# Patient Record
Sex: Male | Born: 1980 | Race: White | Hispanic: No | Marital: Single | Smoking: Never smoker
Health system: Southern US, Community
[De-identification: ages and names within clinical notes are randomized; demographics above are authoritative.]

## PROBLEM LIST (undated history)

## (undated) DIAGNOSIS — K219 Gastro-esophageal reflux disease without esophagitis: Secondary | ICD-10-CM

## (undated) DIAGNOSIS — M359 Systemic involvement of connective tissue, unspecified: Secondary | ICD-10-CM

## (undated) DIAGNOSIS — N183 Chronic kidney disease, stage 3 unspecified: Secondary | ICD-10-CM

## (undated) DIAGNOSIS — K5090 Crohn's disease, unspecified, without complications: Secondary | ICD-10-CM

## (undated) DIAGNOSIS — N289 Disorder of kidney and ureter, unspecified: Secondary | ICD-10-CM

## (undated) DIAGNOSIS — K469 Unspecified abdominal hernia without obstruction or gangrene: Secondary | ICD-10-CM

## (undated) HISTORY — PX: COLONOSCOPY: SHX174

## (undated) HISTORY — PX: OTHER SURGICAL HISTORY: SHX169

## (undated) HISTORY — PX: ESOPHAGOGASTRODUODENOSCOPY: SHX1529

---

## 2012-08-27 ENCOUNTER — Emergency Department: Payer: Self-pay

## 2012-08-27 LAB — CBC
HCT: 46.2 % (ref 40.0–52.0)
HGB: 15.9 g/dL (ref 13.0–18.0)
MCH: 33.1 pg (ref 26.0–34.0)
MCHC: 34.4 g/dL (ref 32.0–36.0)
MCV: 96 fL (ref 80–100)
RDW: 13.1 % (ref 11.5–14.5)
WBC: 11 10*3/uL — ABNORMAL HIGH (ref 3.8–10.6)

## 2012-08-27 LAB — COMPREHENSIVE METABOLIC PANEL
Albumin: 4.2 g/dL (ref 3.4–5.0)
Anion Gap: 3 — ABNORMAL LOW (ref 7–16)
Bilirubin,Total: 0.4 mg/dL (ref 0.2–1.0)
Calcium, Total: 8.8 mg/dL (ref 8.5–10.1)
Chloride: 102 mmol/L (ref 98–107)
Co2: 33 mmol/L — ABNORMAL HIGH (ref 21–32)
Creatinine: 1.36 mg/dL — ABNORMAL HIGH (ref 0.60–1.30)
Glucose: 90 mg/dL (ref 65–99)
Osmolality: 275 (ref 275–301)
Potassium: 4.1 mmol/L (ref 3.5–5.1)
SGOT(AST): 28 U/L (ref 15–37)
SGPT (ALT): 33 U/L (ref 12–78)
Sodium: 138 mmol/L (ref 136–145)
Total Protein: 7.6 g/dL (ref 6.4–8.2)

## 2012-08-27 LAB — LIPASE, BLOOD: Lipase: 504 U/L — ABNORMAL HIGH (ref 73–393)

## 2012-12-11 ENCOUNTER — Emergency Department: Payer: Self-pay | Admitting: Emergency Medicine

## 2012-12-11 LAB — COMPREHENSIVE METABOLIC PANEL
Albumin: 4.3 g/dL (ref 3.4–5.0)
Alkaline Phosphatase: 82 U/L (ref 50–136)
BUN: 13 mg/dL (ref 7–18)
Calcium, Total: 9.5 mg/dL (ref 8.5–10.1)
Chloride: 107 mmol/L (ref 98–107)
Creatinine: 1.64 mg/dL — ABNORMAL HIGH (ref 0.60–1.30)
EGFR (Non-African Amer.): 55 — ABNORMAL LOW
Glucose: 98 mg/dL (ref 65–99)
Osmolality: 285 (ref 275–301)
SGOT(AST): 66 U/L — ABNORMAL HIGH (ref 15–37)
Total Protein: 7.5 g/dL (ref 6.4–8.2)

## 2012-12-11 LAB — CBC
MCHC: 34.6 g/dL (ref 32.0–36.0)
MCV: 95 fL (ref 80–100)
WBC: 9.4 10*3/uL (ref 3.8–10.6)

## 2012-12-22 ENCOUNTER — Ambulatory Visit: Payer: Self-pay | Admitting: Gastroenterology

## 2012-12-25 ENCOUNTER — Ambulatory Visit: Payer: Self-pay | Admitting: Gastroenterology

## 2012-12-29 ENCOUNTER — Ambulatory Visit: Payer: Self-pay | Admitting: Gastroenterology

## 2013-01-01 ENCOUNTER — Emergency Department: Payer: Self-pay | Admitting: Emergency Medicine

## 2013-01-01 LAB — URINALYSIS, COMPLETE
Blood: NEGATIVE
Ketone: NEGATIVE
Nitrite: NEGATIVE
Ph: 6 (ref 4.5–8.0)
Protein: NEGATIVE
WBC UR: <1 /HPF (ref 0–5)

## 2013-01-01 LAB — CBC
HCT: 44.4 % (ref 40.0–52.0)
HGB: 15.7 g/dL (ref 13.0–18.0)
MCV: 93 fL (ref 80–100)
Platelet: 223 10*3/uL (ref 150–440)
RBC: 4.76 10*6/uL (ref 4.40–5.90)
RDW: 13.4 % (ref 11.5–14.5)
WBC: 10.1 10*3/uL (ref 3.8–10.6)

## 2013-01-01 LAB — COMPREHENSIVE METABOLIC PANEL
Anion Gap: 7 (ref 7–16)
BUN: 14 mg/dL (ref 7–18)
Bilirubin,Total: 0.6 mg/dL (ref 0.2–1.0)
Calcium, Total: 9.8 mg/dL (ref 8.5–10.1)
Co2: 28 mmol/L (ref 21–32)
Creatinine: 1.56 mg/dL — ABNORMAL HIGH (ref 0.60–1.30)
EGFR (African American): >60
EGFR (Non-African Amer.): 58 — ABNORMAL LOW
Osmolality: 287 (ref 275–301)
Potassium: 3.5 mmol/L (ref 3.5–5.1)
SGOT(AST): 47 U/L — ABNORMAL HIGH (ref 15–37)
SGPT (ALT): 61 U/L (ref 12–78)
Sodium: 143 mmol/L (ref 136–145)
Total Protein: 7.6 g/dL (ref 6.4–8.2)

## 2013-01-08 ENCOUNTER — Ambulatory Visit: Payer: Self-pay | Admitting: Urology

## 2013-03-31 ENCOUNTER — Emergency Department: Payer: Self-pay | Admitting: Emergency Medicine

## 2013-03-31 LAB — CBC
HCT: 45.3 % (ref 40.0–52.0)
HGB: 15.9 g/dL (ref 13.0–18.0)
MCH: 33.7 pg (ref 26.0–34.0)
MCHC: 35.1 g/dL (ref 32.0–36.0)
RBC: 4.72 10*6/uL (ref 4.40–5.90)
RDW: 12.9 % (ref 11.5–14.5)
WBC: 6.9 10*3/uL (ref 3.8–10.6)

## 2013-03-31 LAB — BASIC METABOLIC PANEL
Calcium, Total: 9.4 mg/dL (ref 8.5–10.1)
Chloride: 107 mmol/L (ref 98–107)
Co2: 28 mmol/L (ref 21–32)
Creatinine: 1.4 mg/dL — ABNORMAL HIGH (ref 0.60–1.30)
EGFR (Non-African Amer.): >60
Glucose: 94 mg/dL (ref 65–99)

## 2013-03-31 LAB — URINALYSIS, COMPLETE
Bilirubin,UR: NEGATIVE
Glucose,UR: NEGATIVE mg/dL (ref 0–75)
Ketone: NEGATIVE
Nitrite: NEGATIVE
Protein: NEGATIVE
Specific Gravity: 1.017 (ref 1.003–1.030)

## 2013-05-20 ENCOUNTER — Ambulatory Visit: Payer: Self-pay | Admitting: Urology

## 2013-06-01 ENCOUNTER — Ambulatory Visit: Payer: Self-pay | Admitting: Gastroenterology

## 2013-07-07 ENCOUNTER — Ambulatory Visit: Payer: Self-pay | Admitting: Gastroenterology

## 2013-09-11 ENCOUNTER — Emergency Department: Payer: Self-pay | Admitting: Emergency Medicine

## 2013-09-11 LAB — URINALYSIS, COMPLETE
BACTERIA: NONE SEEN
Bilirubin,UR: NEGATIVE
Blood: NEGATIVE
Glucose,UR: NEGATIVE mg/dL (ref 0–75)
KETONE: NEGATIVE
Leukocyte Esterase: NEGATIVE
Nitrite: NEGATIVE
Ph: 7 (ref 4.5–8.0)
Protein: NEGATIVE
RBC,UR: <1 /HPF (ref 0–5)
SQUAMOUS EPITHELIAL: NONE SEEN
Specific Gravity: 1.013 (ref 1.003–1.030)
WBC UR: NONE SEEN /HPF (ref 0–5)

## 2013-09-11 LAB — CBC WITH DIFFERENTIAL/PLATELET
BASOS ABS: 0.1 10*3/uL (ref 0.0–0.1)
Basophil %: 1 %
Eosinophil #: 0.1 10*3/uL (ref 0.0–0.7)
Eosinophil %: 1.2 %
HCT: 43.8 % (ref 40.0–52.0)
HGB: 15.3 g/dL (ref 13.0–18.0)
Lymphocyte #: 3.1 10*3/uL (ref 1.0–3.6)
Lymphocyte %: 38.3 %
MCH: 33.4 pg (ref 26.0–34.0)
MCHC: 34.9 g/dL (ref 32.0–36.0)
MCV: 96 fL (ref 80–100)
MONOS PCT: 8.2 %
Monocyte #: 0.7 x10 3/mm (ref 0.2–1.0)
NEUTROS ABS: 4.1 10*3/uL (ref 1.4–6.5)
NEUTROS PCT: 51.3 %
PLATELETS: 191 10*3/uL (ref 150–440)
RBC: 4.57 10*6/uL (ref 4.40–5.90)
RDW: 13 % (ref 11.5–14.5)
WBC: 8 10*3/uL (ref 3.8–10.6)

## 2013-09-11 LAB — BASIC METABOLIC PANEL
Anion Gap: 5 — ABNORMAL LOW (ref 7–16)
BUN: 12 mg/dL (ref 7–18)
CHLORIDE: 106 mmol/L (ref 98–107)
CO2: 29 mmol/L (ref 21–32)
Calcium, Total: 9.2 mg/dL (ref 8.5–10.1)
Creatinine: 1.41 mg/dL — ABNORMAL HIGH (ref 0.60–1.30)
EGFR (African American): >60
EGFR (Non-African Amer.): >60
GLUCOSE: 119 mg/dL — AB (ref 65–99)
Osmolality: 280 (ref 275–301)
Potassium: 4 mmol/L (ref 3.5–5.1)
Sodium: 140 mmol/L (ref 136–145)

## 2013-09-11 LAB — TROPONIN I

## 2014-06-23 ENCOUNTER — Telehealth: Payer: Self-pay | Admitting: Internal Medicine

## 2014-06-23 NOTE — Telephone Encounter (Signed)
Rec'd from [facility] primary Care forward 25 pages to Dr. Juanda ChanceBrodie

## 2014-10-22 ENCOUNTER — Encounter: Payer: Self-pay | Admitting: Nurse Practitioner

## 2015-04-19 ENCOUNTER — Other Ambulatory Visit: Payer: Self-pay | Admitting: Nephrology

## 2015-04-19 DIAGNOSIS — Q615 Medullary cystic kidney: Secondary | ICD-10-CM

## 2015-04-19 DIAGNOSIS — R319 Hematuria, unspecified: Secondary | ICD-10-CM

## 2015-04-26 ENCOUNTER — Ambulatory Visit
Admission: RE | Admit: 2015-04-26 | Discharge: 2015-04-26 | Disposition: A | Discharge status: Home / Self Care | Payer: Self-pay | Source: Ambulatory Visit | Attending: Nephrology | Admitting: Nephrology

## 2015-07-29 ENCOUNTER — Emergency Department
Admission: EM | Admit: 2015-07-29 | Discharge: 2015-07-30 | Disposition: A | Discharge status: Home / Self Care | Payer: BC Managed Care – PPO | Attending: Emergency Medicine | Admitting: Emergency Medicine

## 2015-07-29 ENCOUNTER — Emergency Department: Payer: BC Managed Care – PPO

## 2015-07-29 ENCOUNTER — Encounter: Payer: Self-pay | Admitting: Emergency Medicine

## 2015-07-29 DIAGNOSIS — R1030 Lower abdominal pain, unspecified: Secondary | ICD-10-CM

## 2015-07-29 DIAGNOSIS — R112 Nausea with vomiting, unspecified: Secondary | ICD-10-CM | POA: Insufficient documentation

## 2015-07-29 DIAGNOSIS — R000 Tachycardia, unspecified: Secondary | ICD-10-CM | POA: Diagnosis not present

## 2015-07-29 DIAGNOSIS — R1031 Right lower quadrant pain: Secondary | ICD-10-CM | POA: Diagnosis present

## 2015-07-29 DIAGNOSIS — Z7952 Long term (current) use of systemic steroids: Secondary | ICD-10-CM | POA: Diagnosis not present

## 2015-07-29 DIAGNOSIS — R42 Dizziness and giddiness: Secondary | ICD-10-CM | POA: Diagnosis not present

## 2015-07-29 DIAGNOSIS — F419 Anxiety disorder, unspecified: Secondary | ICD-10-CM | POA: Insufficient documentation

## 2015-07-29 DIAGNOSIS — Z880 Allergy status to penicillin: Secondary | ICD-10-CM | POA: Diagnosis not present

## 2015-07-29 HISTORY — DX: Crohn's disease, unspecified, without complications: K50.90

## 2015-07-29 HISTORY — DX: Chronic kidney disease, stage 3 unspecified: N18.30

## 2015-07-29 HISTORY — DX: Chronic kidney disease, stage 3 (moderate): N18.3

## 2015-07-29 LAB — COMPREHENSIVE METABOLIC PANEL
ALK PHOS: 60 U/L (ref 38–126)
ALT: 24 U/L (ref 17–63)
ANION GAP: 9 (ref 5–15)
AST: 30 U/L (ref 15–41)
Albumin: 5.2 g/dL — ABNORMAL HIGH (ref 3.5–5.0)
BILIRUBIN TOTAL: 0.9 mg/dL (ref 0.3–1.2)
BUN: 15 mg/dL (ref 6–20)
CALCIUM: 9.7 mg/dL (ref 8.9–10.3)
CO2: 26 mmol/L (ref 22–32)
Chloride: 108 mmol/L (ref 101–111)
Creatinine, Ser: 1.54 mg/dL — ABNORMAL HIGH (ref 0.61–1.24)
GFR, EST NON AFRICAN AMERICAN: 57 mL/min — AB (ref >60)
Glucose, Bld: 162 mg/dL — ABNORMAL HIGH (ref 65–99)
Potassium: 4.5 mmol/L (ref 3.5–5.1)
SODIUM: 143 mmol/L (ref 135–145)
TOTAL PROTEIN: 8.2 g/dL — AB (ref 6.5–8.1)

## 2015-07-29 LAB — CBC
HCT: 46.9 % (ref 40.0–52.0)
HEMOGLOBIN: 16.1 g/dL (ref 13.0–18.0)
MCH: 32.7 pg (ref 26.0–34.0)
MCHC: 34.4 g/dL (ref 32.0–36.0)
MCV: 95.1 fL (ref 80.0–100.0)
PLATELETS: 235 10*3/uL (ref 150–440)
RBC: 4.93 MIL/uL (ref 4.40–5.90)
RDW: 13 % (ref 11.5–14.5)
WBC: 11.1 10*3/uL — AB (ref 3.8–10.6)

## 2015-07-29 LAB — TYPE AND SCREEN
ABO/RH(D): A POS
ANTIBODY SCREEN: NEGATIVE

## 2015-07-29 LAB — ABO/RH: ABO/RH(D): A POS

## 2015-07-29 MED ORDER — MORPHINE SULFATE (PF) 4 MG/ML IV SOLN
4.0000 mg | Freq: Once | INTRAVENOUS | Status: AC
  Administered 2015-07-29: 4 mg via INTRAVENOUS
  Filled 2015-07-29: qty 1

## 2015-07-29 MED ORDER — SODIUM CHLORIDE 0.9 % IV SOLN
1000.0000 mL | Freq: Once | INTRAVENOUS | Status: AC
  Administered 2015-07-29: 1000 mL via INTRAVENOUS

## 2015-07-29 MED ORDER — IOHEXOL 300 MG/ML  SOLN
100.0000 mL | Freq: Once | INTRAMUSCULAR | Status: AC | PRN
  Administered 2015-07-29: 100 mL via INTRAVENOUS

## 2015-07-29 MED ORDER — ONDANSETRON HCL 4 MG/2ML IJ SOLN
4.0000 mg | Freq: Once | INTRAMUSCULAR | Status: AC
  Administered 2015-07-29: 4 mg via INTRAVENOUS
  Filled 2015-07-29: qty 2

## 2015-07-29 MED ORDER — IOHEXOL 240 MG/ML SOLN
50.0000 mL | Freq: Once | INTRAMUSCULAR | Status: AC | PRN
  Administered 2015-07-29: 50 mL via ORAL
  Filled 2015-07-29: qty 50

## 2015-07-29 NOTE — ED Notes (Signed)
Report from shannin, rn.  

## 2015-07-29 NOTE — ED Notes (Addendum)
Pt reports dizziness, vomiting and red blood in stool for a few days. Pt on  of prednisone with no improvement, Dr Bluford Kaufmann told pt to come here.

## 2015-07-29 NOTE — ED Notes (Signed)
Pt using urinal. Pt states morphine has improved pain.

## 2015-07-29 NOTE — ED Provider Notes (Signed)
[facility] Emergency Department Provider Note  ____________________________________________    I have reviewed the triage vital signs and the nursing notes.   HISTORY  Chief Complaint Dizziness; Emesis; and Rectal Bleeding    HPI Manuel Collins is a 35 y.o. male presents with abdominal pain nausea and vomiting and dizziness. Patient reports a history of Crohn's disease, he notes bright red blood in his stool for approximately one week. He was put on prednisone but has not had any improvement. Complains of lower abdominal pain primarily in the right lower quadrant that is moderate to severe. He called his gastroenterologist's office and they recommended he come to the emergency department. He denies fevers or chills.     Past Medical History  Diagnosis Date  . Crohn disease ([place])   . Kidney disease, chronic, stage III (moderate, EGFR 30-59 ml/min)     There are no active problems to display for this patient.   No past surgical history on file.  Current Outpatient Rx  Name  Route  Sig  Dispense  Refill  . predniSONE (DELTASONE) 20 MG tablet   Oral   Take 1 tablet by mouth 2 (two) times daily.           Allergies Hydrocodeine; Oxycodone; and Penicillins  No family history on file.  Social History Social History  Substance Use Topics  . Smoking status: Never Smoker   . Smokeless tobacco: None  . Alcohol Use: No    Review of Systems  Constitutional: Negative for fever. Eyes: Negative for visual changes. ENT: Negative for sore throat Cardiovascular: Negative for chest pain. Respiratory: Negative for cough Gastrointestinal: As above Genitourinary: Negative for dysuria. Musculoskeletal: Negative for back pain. Skin: Negative for rash. Neurological: Negative for headaches  Psychiatric: Mild anxiety    ____________________________________________   PHYSICAL EXAM:  VITAL SIGNS: ED Triage Vitals  Enc Vitals Group     BP  07/29/15 1849 131/85 mmHg     Pulse Rate 07/29/15 1849 123     Resp 07/29/15 1849 16     Temp 07/29/15 1849 98.2 F (36.8 C)     Temp Source 07/29/15 1849 Oral     SpO2 07/29/15 1849 98 %     Weight 07/29/15 1849 210 lb (95.255 kg)     Height 07/29/15 1849 5' 9"  (1.753 m)     Head Cir --      Peak Flow --      Pain Score 07/29/15 1849 5     Pain Loc --      Pain Edu? --      Excl. in [place]? --      Constitutional: Alert and oriented. Well appearing and in no distress. Eyes: Conjunctivae are normal.  ENT   Head: Normocephalic and atraumatic.   Mouth/Throat: Mucous membranes are moist. Cardiovascular: Tachycardia, regular rhythm. Normal and symmetric distal pulses are present in all extremities.  Respiratory: Normal respiratory effort without tachypnea nor retractions. Breath sounds are clear and equal bilaterally.  Gastrointestinal: Tenderness to palpation in the right lower quadrant greater than left quadrant. No distention. There is no CVA tenderness. Genitourinary: deferred Musculoskeletal: Nontender with normal range of motion in all extremities.  Neurologic:  Normal speech and language. No gross focal neurologic deficits are appreciated. Skin:  Skin is warm, dry and intact. No rash noted. Psychiatric: Mood and affect are normal. Patient exhibits appropriate insight and judgment.  ____________________________________________    LABS (pertinent positives/negatives)  Labs Reviewed  COMPREHENSIVE METABOLIC PANEL -  Abnormal; Notable for the following:    Glucose, Bld 162 (*)    Creatinine, Ser 1.54 (*)    Total Protein 8.2 (*)    Albumin 5.2 (*)    GFR calc non Af Amer 57 (*)    All other components within normal limits  CBC - Abnormal; Notable for the following:    WBC 11.1 (*)    All other components within normal limits  TYPE AND SCREEN  ABO/RH    ____________________________________________   EKG  None  ____________________________________________     RADIOLOGY I have personally reviewed any xrays that were ordered on this patient: CT abdomen and pelvis pending  ____________________________________________   PROCEDURES  Procedure(s) performed: none  Critical Care performed: none  ____________________________________________   INITIAL IMPRESSION / ASSESSMENT AND PLAN / ED COURSE  Pertinent labs & imaging results that were available during my care of the patient were reviewed by me and considered in my medical decision making (see chart for details).  Patient with history of Crohn's disease who presents with primarily lower abdominal pain right greater than left. He is tachycardic with a slight elevation in his white blood cell count. We will give IV fluids, morphine (he does have an allergy to oxycodone but he has never had morphine and I doubt there'll be cross reactivity, we will observe carefully). Given that he is tender in the right lower quadrant we will obtain CT abdomen and pelvis. Suspect he will require admission.  I have asked Dr. Beather Arbour to follow up on his CT scan  ____________________________________________   FINAL CLINICAL IMPRESSION(S) / ED DIAGNOSES  Final diagnoses:  Lower abdominal pain     Lavonia Drafts, MD 07/29/15 [phone]

## 2015-07-30 MED ORDER — ONDANSETRON 4 MG PO TBDP: 4.0000 mg | ORAL_TABLET | Freq: Three times a day (TID) | ORAL | Status: DC | PRN

## 2015-07-30 MED ORDER — MORPHINE SULFATE (PF) 4 MG/ML IV SOLN
4.0000 mg | Freq: Once | INTRAVENOUS | Status: DC
  Filled 2015-07-30: qty 1

## 2015-07-30 MED ORDER — ONDANSETRON HCL 4 MG/2ML IJ SOLN
4.0000 mg | Freq: Once | INTRAMUSCULAR | Status: AC
  Administered 2015-07-30: 4 mg via INTRAVENOUS
  Filled 2015-07-30: qty 2

## 2015-07-30 MED ORDER — SODIUM CHLORIDE 0.9 % IV BOLUS (SEPSIS): 1000.0000 mL | Freq: Once | INTRAVENOUS | Status: DC

## 2015-07-30 MED ORDER — DICYCLOMINE HCL 20 MG PO TABS: 20.0000 mg | ORAL_TABLET | Freq: Four times a day (QID) | ORAL | Status: AC | PRN

## 2015-07-30 MED ORDER — METHYLPREDNISOLONE SODIUM SUCC 125 MG IJ SOLR
125.0000 mg | Freq: Once | INTRAMUSCULAR | Status: AC
  Administered 2015-07-30: 125 mg via INTRAVENOUS
  Filled 2015-07-30: qty 2

## 2015-07-30 NOTE — ED Notes (Signed)
Pt states "i don't know why they are going to admit me, i think i can go home."

## 2015-07-30 NOTE — Discharge Instructions (Signed)
1. Take medicines as needed for abdominal cramping and nausea (Bentyl/Zofran #20). 2. Clear liquids 12 hours, then bland diet 3 days, then slowly advance diet as tolerated. 3. Return to the ER for worsening symptoms, persistent vomiting, difficulty breathing or other concerns.  Abdominal Pain, Adult Many things can cause abdominal pain. Usually, abdominal pain is not caused by a disease and will improve without treatment. It can often be observed and treated at home. Your health care provider will do a physical exam and possibly order blood tests and X-rays to help determine the seriousness of your pain. However, in many cases, more time must pass before a clear cause of the pain can be found. Before that point, your health care provider may not know if you need more testing or further treatment. HOME CARE INSTRUCTIONS Monitor your abdominal pain for any changes. The following actions may help to alleviate any discomfort you are experiencing:  Only take over-the-counter or prescription medicines as directed by your health care provider.  Do not take laxatives unless directed to do so by your health care provider.  Try a clear liquid diet (broth, tea, or water) as directed by your health care provider. Slowly move to a bland diet as tolerated. SEEK MEDICAL CARE IF:  You have unexplained abdominal pain.  You have abdominal pain associated with nausea or diarrhea.  You have pain when you urinate or have a bowel movement.  You experience abdominal pain that wakes you in the night.  You have abdominal pain that is worsened or improved by eating food.  You have abdominal pain that is worsened with eating fatty foods.  You have a fever. SEEK IMMEDIATE MEDICAL CARE IF:  Your pain does not go away within 2 hours.  You keep throwing up (vomiting).  Your pain is felt only in portions of the abdomen, such as the right side or the left lower portion of the abdomen.  You pass bloody or black  tarry stools. MAKE SURE YOU:  Understand these instructions.  Will watch your condition.  Will get help right away if you are not doing well or get worse.   This information is not intended to replace advice given to you by your health care provider. Make sure you discuss any questions you have with your health care provider.   Document Released: 03/07/2005 Document Revised: 02/16/2015 Document Reviewed: 02/04/2013 Elsevier Interactive Patient Education 2016 Else[employer] Inc[employer]  Nausea and Vomiting Nausea is a sick feeling that often comes before throwing up (vomiting). Vomiting is a reflex where stomach contents come out of your mouth. Vomiting can cause severe loss of body fluids (dehydration). Children and elderly adults can become dehydrated quickly, especially if they also have diarrhea. Nausea and vomiting are symptoms of a condition or disease. It is important to find the cause of your symptoms. CAUSES   Direct irritation of the stomach lining. This irritation can result from increased acid production (gastroesophageal reflux disease), infection, food poisoning, taking certain medicines (such as nonsteroidal anti-inflammatory drugs), alcohol use, or tobacco use.  Signals from the brain.These signals could be caused by a headache, heat exposure, an inner ear disturbance, increased pressure in the brain from injury, infection, a tumor, or a concussion, pain, emotional stimulus, or metabolic problems.  An obstruction in the gastrointestinal tract (bowel obstruction).  Illnesses such as diabetes, hepatitis, gallbladder problems, appendicitis, kidney problems, cancer, sepsis, atypical symptoms of a heart attack, or eating disorders.  Medical treatments such as chemotherapy and radiation.  Receiving  medicine that makes you sleep (general anesthetic) during surgery. DIAGNOSIS Your caregiver may ask for tests to be done if the problems do not improve after a few days. Tests may also be done  if symptoms are severe or if the reason for the nausea and vomiting is not clear. Tests may include:  Urine tests.  Blood tests.  Stool tests.  Cultures (to look for evidence of infection).  X-rays or other imaging studies. Test results can help your caregiver make decisions about treatment or the need for additional tests. TREATMENT You need to stay well hydrated. Drink frequently but in small amounts.You may wish to drink water, sports drinks, clear broth, or eat frozen ice pops or gelatin dessert to help stay hydrated.When you eat, eating slowly may help prevent nausea.There are also some antinausea medicines that may help prevent nausea. HOME CARE INSTRUCTIONS   Take all medicine as directed by your caregiver.  If you do not have an appetite, do not force yourself to eat. However, you must continue to drink fluids.  If you have an appetite, eat a normal diet unless your caregiver tells you differently.  Eat a variety of complex carbohydrates (rice, wheat, potatoes, bread), lean meats, yogurt, fruits, and vegetables.  Avoid high-fat foods because they are more difficult to digest.  Drink enough water and fluids to keep your urine clear or pale yellow.  If you are dehydrated, ask your caregiver for specific rehydration instructions. Signs of dehydration may include:  Severe thirst.  Dry lips and mouth.  Dizziness.  Dark urine.  Decreasing urine frequency and amount.  Confusion.  Rapid breathing or pulse. SEEK IMMEDIATE MEDICAL CARE IF:   You have blood or brown flecks (like coffee grounds) in your vomit.  You have black or bloody stools.  You have a severe headache or stiff neck.  You are confused.  You have severe abdominal pain.  You have chest pain or trouble breathing.  You do not urinate at least once every 8 hours.  You develop cold or clammy skin.  You continue to vomit for longer than 24 to 48 hours.  You have a fever. MAKE SURE YOU:    Understand these instructions.  Will watch your condition.  Will get help right away if you are not doing well or get worse.   This information is not intended to replace advice given to you by your health care provider. Make sure you discuss any questions you have with your health care provider.   Document Released: 05/28/2005 Document Revised: 08/20/2011 Document Reviewed: 10/25/2010 Elsevier Interactive Patient Education [employer]

## 2015-07-30 NOTE — ED Provider Notes (Signed)
-----------------------------------------   12:21 AM on 07/30/2015 -----------------------------------------  CT abdomen and pelvis with contrast interpreted per Dr. Andria Meuse: No acute process demonstrated in the abdomen or pelvis. 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 No evidence of bowel obstruction or inflammation.  Updated patient and spouse of CT results. Patient is complaining of returning pain. He is complaining of feeling like he is going to pass out. Will order additional IV fluids, IV analgesia, IV antiemetic, add IV steroid. Discussed with hospitalist for evaluation in the emergency department for admission.   ----------------------------------------- 12:49 AM on 07/30/2015 -----------------------------------------  Patient now tells me he desires to go home. He is asking to eat and states he actually did not vomit prior to my reassessment. He is no longer feeling dizzy and states he will call Dr. Nigel Bridgeman office on Monday for close follow-up. Will prescribe Bentyl and Zofran to use as needed. Strict return precautions given. Patient is spouse verbalized understanding and agree with plan of care.  Irean Hong, MD 07/30/15 [phone]

## 2016-09-06 ENCOUNTER — Encounter: Payer: Self-pay | Admitting: Emergency Medicine

## 2016-09-06 ENCOUNTER — Emergency Department: Payer: Self-pay

## 2016-09-06 ENCOUNTER — Emergency Department
Admission: EM | Admit: 2016-09-06 | Discharge: 2016-09-06 | Disposition: A | Discharge status: Home / Self Care | Payer: Self-pay | Attending: Emergency Medicine | Admitting: Emergency Medicine

## 2016-09-06 DIAGNOSIS — R1031 Right lower quadrant pain: Secondary | ICD-10-CM | POA: Insufficient documentation

## 2016-09-06 DIAGNOSIS — R1013 Epigastric pain: Secondary | ICD-10-CM | POA: Insufficient documentation

## 2016-09-06 DIAGNOSIS — R109 Unspecified abdominal pain: Secondary | ICD-10-CM

## 2016-09-06 DIAGNOSIS — N183 Chronic kidney disease, stage 3 (moderate): Secondary | ICD-10-CM | POA: Insufficient documentation

## 2016-09-06 DIAGNOSIS — R233 Spontaneous ecchymoses: Secondary | ICD-10-CM | POA: Insufficient documentation

## 2016-09-06 DIAGNOSIS — R1011 Right upper quadrant pain: Secondary | ICD-10-CM | POA: Insufficient documentation

## 2016-09-06 DIAGNOSIS — K920 Hematemesis: Secondary | ICD-10-CM | POA: Insufficient documentation

## 2016-09-06 DIAGNOSIS — Z79899 Other long term (current) drug therapy: Secondary | ICD-10-CM | POA: Insufficient documentation

## 2016-09-06 HISTORY — DX: Systemic involvement of connective tissue, unspecified: M35.9

## 2016-09-06 HISTORY — DX: Disorder of kidney and ureter, unspecified: N28.9

## 2016-09-06 LAB — COMPREHENSIVE METABOLIC PANEL
ALBUMIN: 4.7 g/dL (ref 3.5–5.0)
ALK PHOS: 63 U/L (ref 38–126)
ALT: 25 U/L (ref 17–63)
ANION GAP: 6 (ref 5–15)
AST: 29 U/L (ref 15–41)
BILIRUBIN TOTAL: 0.9 mg/dL (ref 0.3–1.2)
BUN: 11 mg/dL (ref 6–20)
CALCIUM: 9.2 mg/dL (ref 8.9–10.3)
CO2: 28 mmol/L (ref 22–32)
CREATININE: 1.64 mg/dL — AB (ref 0.61–1.24)
Chloride: 108 mmol/L (ref 101–111)
GFR calc Af Amer: >60 mL/min (ref >60)
GFR calc non Af Amer: 52 mL/min — ABNORMAL LOW (ref >60)
GLUCOSE: 90 mg/dL (ref 65–99)
Potassium: 3.9 mmol/L (ref 3.5–5.1)
SODIUM: 142 mmol/L (ref 135–145)
TOTAL PROTEIN: 7.8 g/dL (ref 6.5–8.1)

## 2016-09-06 LAB — CBC
HEMATOCRIT: 44.6 % (ref 40.0–52.0)
Hemoglobin: 15.6 g/dL (ref 13.0–18.0)
MCH: 32.8 pg (ref 26.0–34.0)
MCHC: 35.1 g/dL (ref 32.0–36.0)
MCV: 93.5 fL (ref 80.0–100.0)
Platelets: 198 10*3/uL (ref 150–440)
RBC: 4.77 MIL/uL (ref 4.40–5.90)
RDW: 13.4 % (ref 11.5–14.5)
WBC: 6.7 10*3/uL (ref 3.8–10.6)

## 2016-09-06 LAB — URINALYSIS, COMPLETE (UACMP) WITH MICROSCOPIC
BACTERIA UA: NONE SEEN
Bilirubin Urine: NEGATIVE
Glucose, UA: NEGATIVE mg/dL
HGB URINE DIPSTICK: NEGATIVE
Ketones, ur: NEGATIVE mg/dL
Leukocytes, UA: NEGATIVE
NITRITE: NEGATIVE
PROTEIN: NEGATIVE mg/dL
Specific Gravity, Urine: 1.013 (ref 1.005–1.030)
pH: 6 (ref 5.0–8.0)

## 2016-09-06 LAB — LIPASE, BLOOD: Lipase: 45 U/L (ref 11–51)

## 2016-09-06 MED ORDER — IOPAMIDOL (ISOVUE-300) INJECTION 61%
30.0000 mL | Freq: Once | INTRAVENOUS | Status: AC
  Administered 2016-09-06: 30 mL via ORAL

## 2016-09-06 MED ORDER — ONDANSETRON 4 MG PO TBDP: 4.0000 mg | ORAL_TABLET | Freq: Three times a day (TID) | ORAL | 0 refills | Status: DC | PRN

## 2016-09-06 MED ORDER — SODIUM CHLORIDE 0.9 % IV BOLUS (SEPSIS)
1000.0000 mL | Freq: Once | INTRAVENOUS | Status: AC
Start: 2016-09-06 — End: 2016-09-06
  Administered 2016-09-06: 1000 mL via INTRAVENOUS

## 2016-09-06 MED ORDER — IOPAMIDOL (ISOVUE-370) INJECTION 76%
80.0000 mL | Freq: Once | INTRAVENOUS | Status: AC | PRN
  Administered 2016-09-06: 80 mL via INTRAVENOUS

## 2016-09-06 MED ORDER — HYDROMORPHONE HCL 1 MG/ML IJ SOLN
0.5000 mg | Freq: Once | INTRAMUSCULAR | Status: AC
  Administered 2016-09-06: 0.5 mg via INTRAVENOUS
  Filled 2016-09-06: qty 1

## 2016-09-06 NOTE — Discharge Instructions (Signed)
Please eat small regular meals throughout the day, drink plenty of fluid, and get plenty of rest.  Make an appointment with your primary care physician for the next 1-2 days, and with a gastroenterologist within the next week.  Please return to the emergency department if you develop severe pain, inability to keep down fluids, vomiting with blood, lightheadedness or fainting, fever, or any other symptoms concerning to you.

## 2016-09-06 NOTE — ED Triage Notes (Signed)
Patient to ER for hematemesis, epigastric/RUQ pain x3 days. Patient reports h/o Crohn's. States pain radiates through to back.

## 2016-09-06 NOTE — ED Provider Notes (Signed)
[facility] Emergency Department Provider Note  ____________________________________________  Time seen: Approximately 8:54 AM  I have reviewed the triage vital signs and the nursing notes.   HISTORY  Chief Complaint Abdominal Pain    HPI LOVIS MORE is a 36 y.o. male with a history of Crohn's disease, presenting with 3 days of abdominal pain, vomiting with hematemesis. The patient reports that for the past 3 days he has had a right-sided abdominal pain just below the right lateral rib cage in the right upper quadrant, plus some right lower quadrant pain. In addition he's had 2-3 episodes of vomiting, with hematemesis. The pain is better if he pushes in the right upper quadrant and this morning he noticed the small bruise in the area where he has been pushing. Has not had any fever, diarrhea or constipation. No black or tarry-colored looking stools. He denies alcohol use. He went to [facility] last night but left due to long wait time. His blood work from [facility] shows an elevated lipase, normal hemoglobin and hematocrit.   Past Medical History:  Diagnosis Date  . Collagen vascular disease ([place])   . Crohn disease ([place])   . Kidney disease, chronic, stage III (moderate, EGFR 30-59 ml/min)   . Renal insufficiency     There are no active problems to display for this patient.   History reviewed. No pertinent surgical history.  Current Outpatient Rx  . Order #: [id number] Class: Historical Med  . Order #: [id number] Class: Print  . Order #: [id number] Class: Print    Allergies Hydrocodeine [dihydrocodeine]; Oxycodone; and Penicillins  No family history on file.  Social History Social History  Substance Use Topics  . Smoking status: Never Smoker  . Smokeless tobacco: Never Used  . Alcohol use No    Review of Systems Constitutional: No fever/chills. Eyes: No visual changes. ENT: No sore throat. No congestion or rhinorrhea. Cardiovascular: Denies chest pain.  Denies palpitations. Respiratory: Denies shortness of breath.  No cough. Gastrointestinal: Positive right upper > lower abdominal pain.  abdominal pain.  No nausea, no vomiting.  No diarrhea.  No constipation. Genitourinary: Negative for dysuria. Musculoskeletal: Negative for back pain. Skin: Negative for rash. Positive bruise over the right anterior lateral chest wall. Neurological: Negative for headaches. No focal numbness, tingling or weakness.   10-point ROS otherwise negative.  ____________________________________________   PHYSICAL EXAM:  VITAL SIGNS: ED Triage Vitals  Enc Vitals Group     BP 09/06/16 0824 132/81     Pulse Rate 09/06/16 0824 (!) 58     Resp 09/06/16 0824 18     Temp 09/06/16 0824 98.6 F (37 C)     Temp Source 09/06/16 0824 Oral     SpO2 09/06/16 0824 100 %     Weight 09/06/16 0824 220 lb (99.8 kg)     Height 09/06/16 0824 5' 9"  (1.753 m)     Head Circumference --      Peak Flow --      Pain Score 09/06/16 0823 5     Pain Loc --      Pain Edu? --      Excl. in [place]? --     Constitutional: Alert and oriented. Well appearing and in no acute distress. Answers questions appropriately. Eyes: Conjunctivae are normal.  EOMI. No scleral icterus. Head: Atraumatic. Nose: No congestion/rhinnorhea. Mouth/Throat: Mucous membranes are moist.  Neck: No stridor.  Supple.   Cardiovascular: Normal rate, regular rhythm. No murmurs, rubs or gallops.  Respiratory: Normal respiratory  effort.  No accessory muscle use or retractions. Lungs CTAB.  No wheezes, rales or ronchi. Gastrointestinal: Soft, and nondistended. TTP RUQ>RLQ.  Negative Murphy's sign.  1x3cm bruise over the RUQ just at the lowest aspect of the rib cage. No guarding or rebound.  No peritoneal signs. Musculoskeletal: No LE edema. No ttp in the calves or palpable cords.  Negative Homan's sign. Neurologic:  A&Ox3.  Speech is clear.  Face and smile are symmetric.  EOMI.  Moves all extremities well. Skin:  Skin  is warm, dry and intact. No rash noted. Psychiatric: Mood and affect are normal. Speech and behavior are normal.  Normal judgement.  ____________________________________________   LABS (all labs ordered are listed, but only abnormal results are displayed)  Labs Reviewed  COMPREHENSIVE METABOLIC PANEL - Abnormal; Notable for the following:       Result Value   Creatinine, Ser 1.64 (*)    GFR calc non Af Amer 52 (*)    All other components within normal limits  URINALYSIS, COMPLETE (UACMP) WITH MICROSCOPIC - Abnormal; Notable for the following:    Color, Urine YELLOW (*)    APPearance CLEAR (*)    Squamous Epithelial / LPF 0-5 (*)    All other components within normal limits  CBC  LIPASE, BLOOD   ____________________________________________  EKG  Not indicated ____________________________________________  RADIOLOGY  Ct Abdomen Pelvis W Contrast  Result Date: 09/06/2016 CLINICAL DATA:  Hematemesis and right upper quadrant pain for 3 days EXAM: CT ABDOMEN AND PELVIS WITH CONTRAST TECHNIQUE: Multidetector CT imaging of the abdomen and pelvis was performed using the standard protocol following bolus administration of intravenous contrast. CONTRAST:  80 mL Isovue 370. COMPARISON:  07/29/2015 FINDINGS: Lower chest: No acute abnormality. Hepatobiliary: No focal liver abnormality is seen. No gallstones, gallbladder wall thickening, or biliary dilatation. Pancreas: Unremarkable. No pancreatic ductal dilatation or surrounding inflammatory changes. Spleen: Normal in size without focal abnormality. Adrenals/Urinary Tract: The adrenal glands are within normal limits. Scattered tiny nonobstructing renal stones are noted bilaterally. A small hypodensity is noted within the right kidney consistent with a simple cyst. This is stable from the previous exam. The bladder is decompressed. No ureteral stones are noted. Stomach/Bowel: Stomach is within normal limits. Appendix appears normal. No evidence of  bowel wall thickening, distention, or inflammatory changes. Vascular/Lymphatic: No significant vascular findings are present. No enlarged abdominal or pelvic lymph nodes. Reproductive: Prostate is unremarkable. Other: No abdominal wall hernia or abnormality. No abdominopelvic ascites. Musculoskeletal: No acute or significant osseous findings. IMPRESSION: Scattered nonobstructing renal stones are noted bilaterally. These are stable from previous exam. No other focal abnormality is noted. Electronically Signed   By: Inez Catalina M.D.   On: 09/06/2016 10:58    ____________________________________________   PROCEDURES  Procedure(s) performed: None  Procedures  Critical Care performed: No ____________________________________________   INITIAL IMPRESSION / ASSESSMENT AND PLAN / ED COURSE  Pertinent labs & imaging results that were available during my care of the patient were reviewed by me and considered in my medical decision making (see chart for details).  36 y.o. nail with a history of Crohn's disease presenting with 3 days of right upper quadrant greater than right lower quadrant pain with associated vomiting and hematemesis. Overall, the patient has reassuring vital signs and is afebrile here. He does not have significant evidence of severe anemia or hypovolemia. We will evaluate him for gallbladder disease, pancreatitis, acute Crohn's flare, and also consider reflux or peptic ulcer disease in his differential. A CT  scan has a been ordered as well as laboratory studies. He will receive pain medication and intravenous fluids. Plan reevaluation for final disposition.  ----------------------------------------- 11:52 AM on 09/06/2016 -----------------------------------------  The patient is feeling significantly better at this time. His nausea has resolved and he is tolerating liquid. His pain has almost completely resolved. We have talked about avoiding NSAID medications, and eating a bland  diet to help his pain. His CT scan does not show any acute process and his labs are reassuring. He continues to have reassuring vital signs, and his H&H are also stable and no further workup is necessary in the emergency department for his vomitus with blood yesterday. I have encouraged him to see his primary care physician in the next 1-2 days, and reestablish gastroenterology care for his Crohn's disease in the next to 7 days. He understands return precautions as well as follow-up instructions.  ____________________________________________  FINAL CLINICAL IMPRESSION(S) / ED DIAGNOSES  Final diagnoses:  Hematemesis with nausea  Right-sided abdominal pain of unknown cause         NEW MEDICATIONS STARTED DURING THIS VISIT:  New Prescriptions   ONDANSETRON (ZOFRAN ODT) 4 MG DISINTEGRATING TABLET    Take 1 tablet (4 mg total) by mouth every 8 (eight) hours as needed for nausea or vomiting.      Eula Listen, MD 09/06/16 1153

## 2016-09-25 ENCOUNTER — Other Ambulatory Visit: Payer: Self-pay | Admitting: Gastroenterology

## 2016-09-25 DIAGNOSIS — R1011 Right upper quadrant pain: Secondary | ICD-10-CM

## 2016-10-12 ENCOUNTER — Ambulatory Visit: Payer: BC Managed Care – PPO

## 2017-01-28 ENCOUNTER — Encounter: Payer: Self-pay | Admitting: *Deleted

## 2017-01-29 ENCOUNTER — Encounter: Admission: RE | Payer: Self-pay | Source: Ambulatory Visit

## 2017-01-29 ENCOUNTER — Ambulatory Visit
Admission: RE | Admit: 2017-01-29 | Payer: BC Managed Care – PPO | Source: Ambulatory Visit | Admitting: Gastroenterology

## 2017-01-29 HISTORY — DX: Gastro-esophageal reflux disease without esophagitis: K21.9

## 2017-01-29 HISTORY — DX: Unspecified abdominal hernia without obstruction or gangrene: K46.9

## 2017-01-29 SURGERY — COLONOSCOPY WITH PROPOFOL
Anesthesia: General

## 2018-09-04 ENCOUNTER — Emergency Department
Admission: EM | Admit: 2018-09-04 | Discharge: 2018-09-04 | Disposition: A | Discharge status: Home / Self Care | Payer: Self-pay | Attending: Emergency Medicine | Admitting: Emergency Medicine

## 2018-09-04 ENCOUNTER — Other Ambulatory Visit: Payer: Self-pay

## 2018-09-04 ENCOUNTER — Encounter: Payer: Self-pay | Admitting: *Deleted

## 2018-09-04 DIAGNOSIS — N201 Calculus of ureter: Secondary | ICD-10-CM | POA: Insufficient documentation

## 2018-09-04 DIAGNOSIS — Z79899 Other long term (current) drug therapy: Secondary | ICD-10-CM | POA: Insufficient documentation

## 2018-09-04 DIAGNOSIS — R109 Unspecified abdominal pain: Secondary | ICD-10-CM

## 2018-09-04 MED ORDER — KETOROLAC TROMETHAMINE 10 MG PO TABS: 10.0000 mg | ORAL_TABLET | Freq: Four times a day (QID) | ORAL | 0 refills | Status: AC | PRN

## 2018-09-04 MED ORDER — ONDANSETRON 4 MG PO TBDP
8.0000 mg | ORAL_TABLET | Freq: Once | ORAL | Status: AC
  Administered 2018-09-04: 8 mg via ORAL
  Filled 2018-09-04: qty 2

## 2018-09-04 MED ORDER — ONDANSETRON 4 MG PO TBDP: 4.0000 mg | ORAL_TABLET | Freq: Three times a day (TID) | ORAL | 0 refills | Status: AC | PRN

## 2018-09-04 MED ORDER — PHENAZOPYRIDINE HCL 200 MG PO TABS
200.0000 mg | ORAL_TABLET | Freq: Once | ORAL | Status: AC
  Administered 2018-09-04: 200 mg via ORAL
  Filled 2018-09-04: qty 1

## 2018-09-04 MED ORDER — KETOROLAC TROMETHAMINE 60 MG/2ML IM SOLN
30.0000 mg | Freq: Once | INTRAMUSCULAR | Status: AC
  Administered 2018-09-04: 30 mg via INTRAMUSCULAR
  Filled 2018-09-04: qty 2

## 2018-09-04 MED ORDER — PHENAZOPYRIDINE HCL 200 MG PO TABS: 200.0000 mg | ORAL_TABLET | Freq: Three times a day (TID) | ORAL | 0 refills | Status: AC | PRN

## 2018-09-04 NOTE — ED Provider Notes (Signed)
[facility] Emergency Department Provider Note  ____________________________________________  Time seen: Approximately 6:44 PM  I have reviewed the triage vital signs and the nursing notes.   HISTORY  Chief Complaint Flank Pain    HPI Manuel Collins is a 38 y.o. male with a history of Crohn's disease, GERD, CKD who comes to the ED complaining of right flank pain for the past 4 days.  Radiates to the right lower quadrant and right groin, associated with urinary urgency.  Denies fevers or chills.  Waxing and waning, no aggravating or alleviating factors, at times severe, currently mild.  He reports that he was seen in [facility] emergency department 2 days ago where a CT scan showed a 2 mm kidney stone on the right.  He reports that they gave him Dilaudid in the emergency department but none to go home with, and request more Dilaudid now as that was the only thing that kind of worked for him.  He also notes that he has tamsulosin at home which they gave him which he is taking.  He returns the ED today because his symptoms have not yet improved.  Review of controlled substance reporting system shows that they actually prescribed him 10 Dilaudid tablets.      Past Medical History:  Diagnosis Date  . Abdominal hernia   . Collagen vascular disease ([place])   . Crohn disease ([place])   . GERD (gastroesophageal reflux disease)   . Kidney disease, chronic, stage III (moderate, EGFR 30-59 ml/min) (HCC)   . Renal insufficiency      There are no active problems to display for this patient.    Past Surgical History:  Procedure Laterality Date  . capsule endoscopy    . COLONOSCOPY    . ESOPHAGOGASTRODUODENOSCOPY       Prior to Admission medications   Medication Sig Start Date End Date Taking? Authorizing Provider  dicyclomine (BENTYL) 20 MG tablet Take 1 tablet (20 mg total) by mouth every 6 (six) hours as needed. Patient not taking: Reported on 09/06/2016  07/30/15   Paulette Blanch, MD  ketorolac (TORADOL) 10 MG tablet Take 1 tablet (10 mg total) by mouth every 6 (six) hours as needed for moderate pain. 09/04/18   Carrie Mew, MD  mesalamine (PENTASA) 500 MG CR capsule Take 500 mg by mouth 3 (three) times daily.    [provider]  ondansetron (ZOFRAN ODT) 4 MG disintegrating tablet Take 1 tablet (4 mg total) by mouth every 8 (eight) hours as needed for nausea or vomiting. 09/04/18   Carrie Mew, MD  phenazopyridine (PYRIDIUM) 200 MG tablet Take 1 tablet (200 mg total) by mouth 3 (three) times daily as needed for pain. 09/04/18   Carrie Mew, MD     Allergies Hydrocodeine [dihydrocodeine]; Oxycodone; and Penicillins   No family history on file.  Social History Social History   Tobacco Use  . Smoking status: Never Smoker  . Smokeless tobacco: Never Used  Substance Use Topics  . Alcohol use: No  . Drug use: No    Review of Systems  Constitutional:   No fever or chills.  ENT:   No sore throat. No rhinorrhea. Cardiovascular:   No chest pain or syncope. Respiratory:   No dyspnea or cough. Gastrointestinal:   Negative for abdominal pain, vomiting and diarrhea.  Musculoskeletal:   Right flank pain as above All other systems reviewed and are negative except as documented above in ROS and HPI.  ____________________________________________  PHYSICAL EXAM:  VITAL SIGNS: ED Triage Vitals  Enc Vitals Group     BP 09/04/18 1623 138/80     Pulse Rate 09/04/18 1623 73     Resp 09/04/18 1623 20     Temp 09/04/18 1623 98.6 F (37 C)     Temp Source 09/04/18 1623 Oral     SpO2 09/04/18 1623 99 %     Weight 09/04/18 1621 215 lb (97.5 kg)     Height 09/04/18 1621 5' 9"  (1.753 m)     Head Circumference --      Peak Flow --      Pain Score 09/04/18 1621 10     Pain Loc --      Pain Edu? --      Excl. in [place]? --     Vital signs reviewed, nursing assessments reviewed.   Constitutional:   Alert and oriented.  Non-toxic appearance. Eyes:   Conjunctivae are normal. EOMI. PERRL. ENT      Head:   Normocephalic and atraumatic.      Nose:   No congestion/rhinnorhea.       Mouth/Throat:   MMM      Neck:   No meningismus. Full ROM.  Cardiovascular:   RRR. Symmetric bilateral radial and DP pulses.  No murmurs. Cap refill less than 2 seconds. Respiratory:   Normal respiratory effort without tachypnea/retractions. Breath sounds are clear and equal bilaterally. No wheezes/rales/rhonchi. Gastrointestinal:   Soft and nontender. Non distended. There is no CVA tenderness.  No rebound, rigidity, or guarding. Musculoskeletal:   Normal range of motion in all extremities. No joint effusions.  No lower extremity tenderness.  No edema. Neurologic:   Normal speech and language.  Motor grossly intact. No acute focal neurologic deficits are appreciated.  Skin:    Skin is warm, dry and intact. No rash noted.  No petechiae, purpura, or bullae.  ____________________________________________    LABS (pertinent positives/negatives) (all labs ordered are listed, but only abnormal results are displayed) Labs Reviewed - No data to display ____________________________________________   EKG    ____________________________________________    RADIOLOGY  No results found.  ____________________________________________   PROCEDURES Procedures  ____________________________________________    CLINICAL IMPRESSION / ASSESSMENT AND PLAN / ED COURSE  Medications ordered in the ED: Medications  ketorolac (TORADOL) injection 30 mg (30 mg Intramuscular Given 09/04/18 1830)  ondansetron (ZOFRAN-ODT) disintegrating tablet 8 mg (8 mg Oral Given 09/04/18 1831)  phenazopyridine (PYRIDIUM) tablet 200 mg (200 mg Oral Given 09/04/18 1831)    Pertinent labs & imaging results that were available during my care of the patient were reviewed by me and considered in my medical decision making (see chart for details).    Patient  complains of persistent colicky right flank pain over the past 4 days, consistent with known 2 mm right side ureterolithiasis diagnosed 2 days ago in the [facility] system.  At that time CT scan showed the stone was situated at the right UVJ without high-grade obstruction, likely to pass imminently.  The patient is actually very calm and comfortable appearing currently, and may have already passed the stone.  Urinalysis from 2 days ago did not show any signs of infection.  No further work-up needed today.  The patient is nontoxic with normal vital signs.  He is requesting Dilaudid, but I will give him Toradol and Pyridium and advised him to follow-up with urology if needed next week.      ____________________________________________   FINAL CLINICAL IMPRESSION(S) /  ED DIAGNOSES    Final diagnoses:  Ureterolithiasis  Right flank pain     ED Discharge Orders         Ordered    ketorolac (TORADOL) 10 MG tablet  Every 6 hours PRN     09/04/18 1844    phenazopyridine (PYRIDIUM) 200 MG tablet  3 times daily PRN     09/04/18 1844    ondansetron (ZOFRAN ODT) 4 MG disintegrating tablet  Every 8 hours PRN     09/04/18 1844          Portions of this note were generated with dragon dictation software. Dictation errors may occur despite best attempts at proofreading.   Carrie Mew, MD 09/04/18 [phone]

## 2018-09-04 NOTE — ED Triage Notes (Signed)
Pt has right flank pain for 4 days.  Hx kidney stones.  Pt has n/v.  Pt alert.

## 2020-07-21 ENCOUNTER — Emergency Department: Payer: Self-pay

## 2020-07-21 ENCOUNTER — Other Ambulatory Visit: Payer: Self-pay

## 2020-07-21 ENCOUNTER — Emergency Department
Admission: EM | Admit: 2020-07-21 | Discharge: 2020-07-21 | Disposition: A | Discharge status: Home / Self Care | Payer: Self-pay | Attending: Student in an Organized Health Care Education/Training Program | Admitting: Student in an Organized Health Care Education/Training Program

## 2020-07-21 DIAGNOSIS — N183 Chronic kidney disease, stage 3 unspecified: Secondary | ICD-10-CM | POA: Insufficient documentation

## 2020-07-21 DIAGNOSIS — Z87442 Personal history of urinary calculi: Secondary | ICD-10-CM | POA: Insufficient documentation

## 2020-07-21 DIAGNOSIS — R1013 Epigastric pain: Secondary | ICD-10-CM | POA: Insufficient documentation

## 2020-07-21 DIAGNOSIS — R1030 Lower abdominal pain, unspecified: Secondary | ICD-10-CM | POA: Insufficient documentation

## 2020-07-21 DIAGNOSIS — Z8719 Personal history of other diseases of the digestive system: Secondary | ICD-10-CM | POA: Insufficient documentation

## 2020-07-21 DIAGNOSIS — K219 Gastro-esophageal reflux disease without esophagitis: Secondary | ICD-10-CM | POA: Insufficient documentation

## 2020-07-21 DIAGNOSIS — M549 Dorsalgia, unspecified: Secondary | ICD-10-CM | POA: Insufficient documentation

## 2020-07-21 DIAGNOSIS — R109 Unspecified abdominal pain: Secondary | ICD-10-CM

## 2020-07-21 LAB — URINALYSIS, COMPLETE (UACMP) WITH MICROSCOPIC
Bacteria, UA: NONE SEEN
Bilirubin Urine: NEGATIVE
Glucose, UA: NEGATIVE mg/dL
Hgb urine dipstick: NEGATIVE
Ketones, ur: NEGATIVE mg/dL
Leukocytes,Ua: NEGATIVE
Nitrite: NEGATIVE
Protein, ur: NEGATIVE mg/dL
Specific Gravity, Urine: 1.011 (ref 1.005–1.030)
pH: 6 (ref 5.0–8.0)

## 2020-07-21 LAB — COMPREHENSIVE METABOLIC PANEL
ALT: 21 U/L (ref 0–44)
AST: 23 U/L (ref 15–41)
Albumin: 4.7 g/dL (ref 3.5–5.0)
Alkaline Phosphatase: 57 U/L (ref 38–126)
Anion gap: 9 (ref 5–15)
BUN: 10 mg/dL (ref 6–20)
CO2: 24 mmol/L (ref 22–32)
Calcium: 9.3 mg/dL (ref 8.9–10.3)
Chloride: 107 mmol/L (ref 98–111)
Creatinine, Ser: 1.56 mg/dL — ABNORMAL HIGH (ref 0.61–1.24)
GFR, Estimated: 57 mL/min — ABNORMAL LOW (ref >60)
Glucose, Bld: 102 mg/dL — ABNORMAL HIGH (ref 70–99)
Potassium: 4.4 mmol/L (ref 3.5–5.1)
Sodium: 140 mmol/L (ref 135–145)
Total Bilirubin: 1.1 mg/dL (ref 0.3–1.2)
Total Protein: 7.9 g/dL (ref 6.5–8.1)

## 2020-07-21 LAB — CBC
HCT: 44.7 % (ref 39.0–52.0)
Hemoglobin: 15.5 g/dL (ref 13.0–17.0)
MCH: 33 pg (ref 26.0–34.0)
MCHC: 34.7 g/dL (ref 30.0–36.0)
MCV: 95.3 fL (ref 80.0–100.0)
Platelets: 212 10*3/uL (ref 150–400)
RBC: 4.69 MIL/uL (ref 4.22–5.81)
RDW: 12.9 % (ref 11.5–15.5)
WBC: 7.7 10*3/uL (ref 4.0–10.5)
nRBC: 0 % (ref 0.0–0.2)

## 2020-07-21 LAB — TROPONIN I (HIGH SENSITIVITY): Troponin I (High Sensitivity): 2 ng/L (ref <18)

## 2020-07-21 LAB — LIPASE, BLOOD: Lipase: 62 U/L — ABNORMAL HIGH (ref 11–51)

## 2020-07-21 MED ORDER — SODIUM CHLORIDE 0.9 % IV BOLUS
1000.0000 mL | Freq: Once | INTRAVENOUS | Status: AC
  Administered 2020-07-21: 1000 mL via INTRAVENOUS

## 2020-07-21 MED ORDER — ONDANSETRON HCL 4 MG/2ML IJ SOLN
4.0000 mg | Freq: Once | INTRAMUSCULAR | Status: DC
  Filled 2020-07-21: qty 2

## 2020-07-21 MED ORDER — MORPHINE SULFATE (PF) 4 MG/ML IV SOLN
4.0000 mg | INTRAVENOUS | Status: DC | PRN
  Filled 2020-07-21: qty 1

## 2020-07-21 MED ORDER — HYDROCODONE-ACETAMINOPHEN 5-325 MG PO TABS: 2.0000 | ORAL_TABLET | Freq: Four times a day (QID) | ORAL | 0 refills | Status: AC | PRN

## 2020-07-21 MED ORDER — IOHEXOL 300 MG/ML  SOLN
100.0000 mL | Freq: Once | INTRAMUSCULAR | Status: AC | PRN
  Administered 2020-07-21: 100 mL via INTRAVENOUS

## 2020-07-21 NOTE — ED Notes (Signed)
Lab called in regards to unresulted troponin. Lab stating it would take another . MD made aware.

## 2020-07-21 NOTE — ED Triage Notes (Signed)
Pt here for RUQ pain that radiates to his back.

## 2020-07-21 NOTE — ED Provider Notes (Signed)
[facility] Emergency Department Provider Note    Event Date/Time   First MD Initiated Contact with Patient 07/21/20 [phone]     (approximate)  I have reviewed the triage vital signs and the nursing notes.   HISTORY  Chief Complaint ruq pain   HPI Manuel Collins is a 40 y.o. male below listed past medical history presents to the ER for right-sided flank and back pain associated with nausea.  Does feel some pain going into his groin.  Does have a history of kidney stone but also states he has a history of pancreatitis.  He does not drink or smoke.  No recent medication changes no recent injections.  Has not had any measured fevers or chills.  Rates the pain is mild to moderate.    Past Medical History:  Diagnosis Date  . Abdominal hernia   . Collagen vascular disease ([place])   . Crohn disease ([place])   . GERD (gastroesophageal reflux disease)   . Kidney disease, chronic, stage III (moderate, EGFR 30-59 ml/min) (HCC)   . Renal insufficiency    No family history on file. Past Surgical History:  Procedure Laterality Date  . capsule endoscopy    . COLONOSCOPY    . ESOPHAGOGASTRODUODENOSCOPY     There are no problems to display for this patient.     Prior to Admission medications   Medication Sig Start Date End Date Taking? Authorizing Provider  HYDROcodone-acetaminophen (NORCO) 5-325 MG tablet Take 2 tablets by mouth every 6 (six) hours as needed for moderate pain. 07/21/20  Yes Merlyn Lot, MD  dicyclomine (BENTYL) 20 MG tablet Take 1 tablet (20 mg total) by mouth every 6 (six) hours as needed. Patient not taking: Reported on 09/06/2016 07/30/15   Paulette Blanch, MD  ketorolac (TORADOL) 10 MG tablet Take 1 tablet (10 mg total) by mouth every 6 (six) hours as needed for moderate pain. 09/04/18   Carrie Mew, MD  mesalamine (PENTASA) 500 MG CR capsule Take 500 mg by mouth 3 (three) times daily.    [provider]  ondansetron (ZOFRAN ODT) 4  MG disintegrating tablet Take 1 tablet (4 mg total) by mouth every 8 (eight) hours as needed for nausea or vomiting. 09/04/18   Carrie Mew, MD  phenazopyridine (PYRIDIUM) 200 MG tablet Take 1 tablet (200 mg total) by mouth 3 (three) times daily as needed for pain. 09/04/18   Carrie Mew, MD    Allergies Hydrocodeine [dihydrocodeine], Oxycodone, and Penicillins    Social History Social History   Tobacco Use  . Smoking status: Never Smoker  . Smokeless tobacco: Never Used  Vaping Use  . Vaping Use: Never used  Substance Use Topics  . Alcohol use: No  . Drug use: No    Review of Systems Patient denies headaches, rhinorrhea, blurry vision, numbness, shortness of breath, chest pain, edema, cough, abdominal pain, nausea, vomiting, diarrhea, dysuria, fevers, rashes or hallucinations unless otherwise stated above in HPI. ____________________________________________   PHYSICAL EXAM:  VITAL SIGNS: Vitals:   07/21/20 1300 07/21/20 1320  BP:  125/75  Pulse: 71 75  Resp: 19   Temp:    SpO2: 100% 99%    Constitutional: Alert and oriented.  Eyes: Conjunctivae are normal.  Head: Atraumatic. Nose: No congestion/rhinnorhea. Mouth/Throat: Mucous membranes are moist.   Neck: No stridor. Painless ROM.  Cardiovascular: Normal rate, regular rhythm. Grossly normal heart sounds.  Good peripheral circulation. Respiratory: Normal respiratory effort.  No retractions. Lungs CTAB. Gastrointestinal: Soft  and nontender. No distention. No abdominal bruits. No CVA tenderness. Genitourinary:  Musculoskeletal: No lower extremity tenderness nor edema.  No joint effusions. Neurologic:  Normal speech and language. No gross focal neurologic deficits are appreciated. No facial droop Skin:  Skin is warm, dry and intact. No rash noted. Psychiatric: Mood and affect are normal. Speech and behavior are normal.  ____________________________________________   LABS (all labs ordered are listed,  but only abnormal results are displayed)  Results for orders placed or performed during the hospital encounter of 07/21/20 (from the past 24 hour(s))  Lipase, blood     Status: Abnormal   Collection Time: 07/21/20  9:05 AM  Result Value Ref Range   Lipase 62 (H) 11 - 51 U/L  Comprehensive metabolic panel     Status: Abnormal   Collection Time: 07/21/20  9:05 AM  Result Value Ref Range   Sodium 140 135 - 145 mmol/L   Potassium 4.4 3.5 - 5.1 mmol/L   Chloride 107 98 - 111 mmol/L   CO2 24 22 - 32 mmol/L   Glucose, Bld 102 (H) 70 - 99 mg/dL   BUN 10 6 - 20 mg/dL   Creatinine, Ser 1.56 (H) 0.61 - 1.24 mg/dL   Calcium 9.3 8.9 - 10.3 mg/dL   Total Protein 7.9 6.5 - 8.1 g/dL   Albumin 4.7 3.5 - 5.0 g/dL   AST 23 15 - 41 U/L   ALT 21 0 - 44 U/L   Alkaline Phosphatase 57 38 - 126 U/L   Total Bilirubin 1.1 0.3 - 1.2 mg/dL   GFR, Estimated 57 (L) >60 mL/min   Anion gap 9 5 - 15  CBC     Status: None   Collection Time: 07/21/20  9:05 AM  Result Value Ref Range   WBC 7.7 4.0 - 10.5 K/uL   RBC 4.69 4.22 - 5.81 MIL/uL   Hemoglobin 15.5 13.0 - 17.0 g/dL   HCT 44.7 39.0 - 52.0 %   MCV 95.3 80.0 - 100.0 fL   MCH 33.0 26.0 - 34.0 pg   MCHC 34.7 30.0 - 36.0 g/dL   RDW 12.9 11.5 - 15.5 %   Platelets 212 150 - 400 K/uL   nRBC 0.0 0.0 - 0.2 %  Urinalysis, Complete w Microscopic     Status: Abnormal   Collection Time: 07/21/20  9:05 AM  Result Value Ref Range   Color, Urine YELLOW (A) YELLOW   APPearance CLEAR (A) CLEAR   Specific Gravity, Urine 1.011 1.005 - 1.030   pH 6.0 5.0 - 8.0   Glucose, UA NEGATIVE NEGATIVE mg/dL   Hgb urine dipstick NEGATIVE NEGATIVE   Bilirubin Urine NEGATIVE NEGATIVE   Ketones, ur NEGATIVE NEGATIVE mg/dL   Protein, ur NEGATIVE NEGATIVE mg/dL   Nitrite NEGATIVE NEGATIVE   Leukocytes,Ua NEGATIVE NEGATIVE   RBC / HPF 0-5 0 - 5 RBC/hpf   WBC, UA 0-5 0 - 5 WBC/hpf   Bacteria, UA NONE SEEN NONE SEEN   Squamous Epithelial / LPF 0-5 0 - 5   Mucus PRESENT    Troponin I (High Sensitivity)     Status: None   Collection Time: 07/21/20  9:05 AM  Result Value Ref Range   Troponin I (High Sensitivity) 2 <18 ng/L   ____________________________________________  EKG My review and personal interpretation at Time: 12:37   Indication: flank pain  Rate: 85  Rhythm: sinus Axis: normal Other: nonspecific st abn, no stemi ____________________________________________  RADIOLOGY  I personally reviewed all radiographic  images ordered to evaluate for the above acute complaints and reviewed radiology reports and findings.  These findings were personally discussed with the patient.  Please see medical record for radiology report.  ____________________________________________   PROCEDURES  Procedure(s) performed:  Procedures    Critical Care performed: no ____________________________________________   INITIAL IMPRESSION / ASSESSMENT AND PLAN / ED COURSE  Pertinent labs & imaging results that were available during my care of the patient were reviewed by me and considered in my medical decision making (see chart for details).   DDX: Musculoskeletal strain, cholelithiasis, pancreatitis, enteritis, hepatitis, [place], ACS, stone  Manuel Collins is a 40 y.o. who presents to the ED with presentation as described above.  Patient nontoxic-appearing does have some mild right-sided and epigastric abdominal pain radiating through his back with borderline elevated lipase.  Question pancreatitis.  CT imaging will be ordered for by differential will give IV fluids as well as IV narcotic medication for pain.  Clinical Course as of 07/21/20 1518  Thu Jul 21, 2020  1222 Anion gap: 9 [PR]    Clinical Course User Index [PR] Merlyn Lot, MD    The patient was evaluated in Emergency Department today for the symptoms described in the history of present illness. He/she was evaluated in the context of the global COVID-19 pandemic, which necessitated consideration  that the patient might be at risk for infection with the SARS-CoV-2 virus that causes COVID-19. Institutional protocols and algorithms that pertain to the evaluation of patients at risk for COVID-19 are in a state of rapid change based on information released by regulatory bodies including the CDC and federal and state organizations. These policies and algorithms were followed during the patient's care in the ED.  As part of my medical decision making, I reviewed the following data within the [place] notes reviewed and incorporated, Labs reviewed, notes from prior ED visits and [place] Controlled Substance Database   ____________________________________________   FINAL CLINICAL IMPRESSION(S) / ED DIAGNOSES  Final diagnoses:  Right flank pain      NEW MEDICATIONS STARTED DURING THIS VISIT:  Discharge Medication List as of 07/21/2020 12:53 PM    START taking these medications   Details  HYDROcodone-acetaminophen (NORCO) 5-325 MG tablet Take 2 tablets by mouth every 6 (six) hours as needed for moderate pain., Starting Thu 07/21/2020, Normal         Note:  This document was prepared using Dragon voice recognition software and may include unintentional dictation errors.    Merlyn Lot, MD 07/21/20 [phone]

## 2020-07-21 NOTE — ED Notes (Signed)
Patient updated by Dr. Roxan Hockey at bedside. This RN also updated patient on POC and potential wait time to discharge. No questions per patient.

## 2020-07-21 NOTE — Discharge Instructions (Addendum)

## 2020-08-13 ENCOUNTER — Other Ambulatory Visit: Payer: Self-pay

## 2020-08-13 ENCOUNTER — Encounter: Payer: Self-pay | Admitting: Emergency Medicine

## 2020-08-13 ENCOUNTER — Emergency Department
Admission: EM | Admit: 2020-08-13 | Discharge: 2020-08-13 | Disposition: A | Discharge status: Home / Self Care | Payer: Self-pay | Attending: Emergency Medicine | Admitting: Emergency Medicine

## 2020-08-13 DIAGNOSIS — R202 Paresthesia of skin: Secondary | ICD-10-CM | POA: Insufficient documentation

## 2020-08-13 DIAGNOSIS — R42 Dizziness and giddiness: Secondary | ICD-10-CM | POA: Insufficient documentation

## 2020-08-13 DIAGNOSIS — R110 Nausea: Secondary | ICD-10-CM | POA: Insufficient documentation

## 2020-08-13 DIAGNOSIS — R319 Hematuria, unspecified: Secondary | ICD-10-CM | POA: Insufficient documentation

## 2020-08-13 DIAGNOSIS — R109 Unspecified abdominal pain: Secondary | ICD-10-CM | POA: Insufficient documentation

## 2020-08-13 DIAGNOSIS — K219 Gastro-esophageal reflux disease without esophagitis: Secondary | ICD-10-CM | POA: Insufficient documentation

## 2020-08-13 DIAGNOSIS — N183 Chronic kidney disease, stage 3 unspecified: Secondary | ICD-10-CM | POA: Insufficient documentation

## 2020-08-13 DIAGNOSIS — H81399 Other peripheral vertigo, unspecified ear: Secondary | ICD-10-CM

## 2020-08-13 LAB — BASIC METABOLIC PANEL
Anion gap: 12 (ref 5–15)
BUN: 15 mg/dL (ref 6–20)
CO2: 20 mmol/L — ABNORMAL LOW (ref 22–32)
Calcium: 9.8 mg/dL (ref 8.9–10.3)
Chloride: 106 mmol/L (ref 98–111)
Creatinine, Ser: 1.64 mg/dL — ABNORMAL HIGH (ref 0.61–1.24)
GFR, Estimated: 54 mL/min — ABNORMAL LOW (ref >60)
Glucose, Bld: 154 mg/dL — ABNORMAL HIGH (ref 70–99)
Potassium: 3.8 mmol/L (ref 3.5–5.1)
Sodium: 138 mmol/L (ref 135–145)

## 2020-08-13 LAB — URINALYSIS, COMPLETE (UACMP) WITH MICROSCOPIC
Bilirubin Urine: NEGATIVE
Glucose, UA: NEGATIVE mg/dL
Ketones, ur: 5 mg/dL — AB
Leukocytes,Ua: NEGATIVE
Nitrite: NEGATIVE
Protein, ur: NEGATIVE mg/dL
Specific Gravity, Urine: 1.02 (ref 1.005–1.030)
pH: 5 (ref 5.0–8.0)

## 2020-08-13 LAB — CBC
HCT: 43.5 % (ref 39.0–52.0)
Hemoglobin: 15.8 g/dL (ref 13.0–17.0)
MCH: 33.4 pg (ref 26.0–34.0)
MCHC: 36.3 g/dL — ABNORMAL HIGH (ref 30.0–36.0)
MCV: 92 fL (ref 80.0–100.0)
Platelets: 268 10*3/uL (ref 150–400)
RBC: 4.73 MIL/uL (ref 4.22–5.81)
RDW: 12.6 % (ref 11.5–15.5)
WBC: 8.1 10*3/uL (ref 4.0–10.5)
nRBC: 0 % (ref 0.0–0.2)

## 2020-08-13 LAB — CBG MONITORING, ED: Glucose-Capillary: 128 mg/dL — ABNORMAL HIGH (ref 70–99)

## 2020-08-13 MED ORDER — KETOROLAC TROMETHAMINE 30 MG/ML IJ SOLN
30.0000 mg | Freq: Once | INTRAMUSCULAR | Status: AC
  Administered 2020-08-13: 30 mg via INTRAVENOUS
  Filled 2020-08-13: qty 1

## 2020-08-13 MED ORDER — SODIUM CHLORIDE 0.9 % IV BOLUS
1000.0000 mL | Freq: Once | INTRAVENOUS | Status: AC
  Administered 2020-08-13: 1000 mL via INTRAVENOUS

## 2020-08-13 MED ORDER — MECLIZINE HCL 25 MG PO TABS: 25.0000 mg | ORAL_TABLET | Freq: Three times a day (TID) | ORAL | 0 refills | Status: AC | PRN

## 2020-08-13 NOTE — ED Provider Notes (Signed)
[facility] Emergency Department Provider Note ____________________________________________   Event Date/Time   First MD Initiated Contact with Patient 08/13/20 1334     (approximate)  I have reviewed the triage vital signs and the nursing notes.   HISTORY  Chief Complaint Hematuria, Tingling, and Weakness    HPI Manuel Collins is a 40 y.o. male with PMH as noted below including a prior history of kidney stones who presents with hematuria, acute onset this afternoon, now resolving, and associated with left flank pain.  He states the pain is similar to prior kidney stones.  He states that on the way to the hospital he had a sensation of a shock throughout his body and then felt a sensation of tingling in his bilateral hands and feet.  He felt like he might pass out, but did not lose consciousness this has now also mostly subsided.  He denies any prior history of this symptom.  He incidentally reports multiple episodes over the last few months of dizziness which he describes as spinning.  It is sudden in onset, lasts 15 to 20 minutes, and resolves on its own.  He has associated nausea and feels unsteady on his feet like he needs to hold onto things.  He has not been evaluated for this previously.   Past Medical History:  Diagnosis Date  . Abdominal hernia   . Collagen vascular disease ([place])   . Crohn disease ([place])   . GERD (gastroesophageal reflux disease)   . Kidney disease, chronic, stage III (moderate, EGFR 30-59 ml/min) (HCC)   . Renal insufficiency     There are no problems to display for this patient.   Past Surgical History:  Procedure Laterality Date  . capsule endoscopy    . COLONOSCOPY    . ESOPHAGOGASTRODUODENOSCOPY      Prior to Admission medications   Medication Sig Start Date End Date Taking? Authorizing Provider  dicyclomine (BENTYL) 20 MG tablet Take 1 tablet (20 mg total) by mouth every 6 (six) hours as needed. Patient not taking:  Reported on 09/06/2016 07/30/15   Paulette Blanch, MD  HYDROcodone-acetaminophen [facility]) 5-325 MG tablet Take 2 tablets by mouth every 6 (six) hours as needed for moderate pain. 07/21/20   Merlyn Lot, MD  ketorolac (TORADOL) 10 MG tablet Take 1 tablet (10 mg total) by mouth every 6 (six) hours as needed for moderate pain. 09/04/18   Carrie Mew, MD  mesalamine (PENTASA) 500 MG CR capsule Take 500 mg by mouth 3 (three) times daily.    [provider]  ondansetron (ZOFRAN ODT) 4 MG disintegrating tablet Take 1 tablet (4 mg total) by mouth every 8 (eight) hours as needed for nausea or vomiting. 09/04/18   Carrie Mew, MD  phenazopyridine (PYRIDIUM) 200 MG tablet Take 1 tablet (200 mg total) by mouth 3 (three) times daily as needed for pain. 09/04/18   Carrie Mew, MD    Allergies Hydrocodeine [dihydrocodeine], Oxycodone, and Penicillins  History reviewed. No pertinent family history.  Social History Social History   Tobacco Use  . Smoking status: Never Smoker  . Smokeless tobacco: Never Used  Vaping Use  . Vaping Use: Never used  Substance Use Topics  . Alcohol use: No  . Drug use: No    Review of Systems  Constitutional: No fever. Eyes: No visual changes. ENT: No sore throat. Cardiovascular: Denies chest pain. Respiratory: Denies shortness of breath. Gastrointestinal: Positive for nausea. Genitourinary: Negative for dysuria.  Positive for hematuria.  Musculoskeletal: Negative for back pain. Skin: Negative for rash. Neurological: Negative for headache.   ____________________________________________   PHYSICAL EXAM:  VITAL SIGNS: ED Triage Vitals  Enc Vitals Group     BP 08/13/20 1132 119/87     Pulse Rate 08/13/20 1132 76     Resp 08/13/20 1132 20     Temp 08/13/20 1132 97.7 F (36.5 C)     Temp Source 08/13/20 1132 Oral     SpO2 08/13/20 1132 99 %     Weight 08/13/20 1127 200 lb (90.7 kg)     Height 08/13/20 1127 5' 9"  (1.753 m)     Head  Circumference --      Peak Flow --      Pain Score 08/13/20 1127 0     Pain Loc --      Pain Edu? --      Excl. in [place]? --     Constitutional: Alert and oriented. Well appearing and in no acute distress. Eyes: Conjunctivae are normal.  EOMI.  PERRLA. Head: Atraumatic. Nose: No congestion/rhinnorhea. Mouth/Throat: Mucous membranes are moist.   Neck: Normal range of motion.  Cardiovascular: Normal rate, regular rhythm.  Good peripheral circulation. Respiratory: Normal respiratory effort.  No retractions. Gastrointestinal: Soft and nontender. No distention.  Genitourinary: Mild left CVA and flank tenderness. Musculoskeletal: Extremities warm and well perfused.  Neurologic:  Normal speech and language.  Motor intact in all extremities.  Normal coordination.  No gross focal neurologic deficits are appreciated.  Skin:  Skin is warm and dry. No rash noted. Psychiatric: Mood and affect are normal. Speech and behavior are normal.  ____________________________________________   LABS (all labs ordered are listed, but only abnormal results are displayed)  Labs Reviewed  BASIC METABOLIC PANEL - Abnormal; Notable for the following components:      Result Value   CO2 20 (*)    Glucose, Bld 154 (*)    Creatinine, Ser 1.64 (*)    GFR, Estimated 54 (*)    All other components within normal limits  CBC - Abnormal; Notable for the following components:   MCHC 36.3 (*)    All other components within normal limits  CBG MONITORING, ED - Abnormal; Notable for the following components:   Glucose-Capillary 128 (*)    All other components within normal limits  URINALYSIS, COMPLETE (UACMP) WITH MICROSCOPIC   ____________________________________________  EKG  ED ECG REPORT I, Arta Silence, the attending physician, personally viewed and interpreted this ECG.  Date: 08/13/2020 EKG Time: 1129 Rate: 70 Rhythm: normal sinus rhythm QRS Axis: normal Intervals: normal ST/T Wave  abnormalities: normal Narrative Interpretation: no evidence of acute ischemia  ____________________________________________  RADIOLOGY    ____________________________________________   PROCEDURES  Procedure(s) performed: No  Procedures  Critical Care performed: No ____________________________________________   INITIAL IMPRESSION / ASSESSMENT AND PLAN / ED COURSE  Pertinent labs & imaging results that were available during my care of the patient were reviewed by me and considered in my medical decision making (see chart for details).  40 year old male with PMH as noted above including a prior history of kidney stones, Crohn's disease, and chronic kidney disease presents with acute onset of hematuria and left flank pain.  He then had a sensation of a "shock" or jolt in some paresthesias in bilateral arms and legs associated with near syncope.  He also reports subacute symptoms of intermittent dizziness.  On exam, the patient is well-appearing.  His vital signs are normal.  The physical exam is  unremarkable.  He has mild left flank and CVA tenderness but no abdominal tenderness.  Neurologic exam is nonfocal.  EKG shows no acute abnormality.  1.  Hematuria/flank pain: This is most consistent with a kidney stone.  Given that the patient has had multiple CTs in the past, has usually passed the stones, and the current symptoms are consistent with his prior, there is no indication for imaging at this time.  We will give fluids, analgesia, and obtain a urinalysis.  If there are abnormal findings on the lab work-up or refractory pain, he may need imaging.  2.  "Shock" sensation/near syncope: This is most consistent with vasovagal near syncope or other benign etiology.  EKG is normal.  The patient's vital signs are normal.  There is no evidence of cardiac cause.  We will obtain basic labs.  3.  Dizziness: This is consistent with peripheral vertigo as it is episodic and associated with  nausea.  I will prescribe meclizine.  There is no indication for ED work-up.  ----------------------------------------- 3:54 PM on 08/13/2020 -----------------------------------------  The patient reports significantly improved pain after Toradol.  Lab work-up is unremarkable.  He is still pending a urinalysis.  If this shows hematuria with no signs of infection, the patient will be appropriate for discharge home.  I signed the patient out to the oncoming physician Dr. Kerman Passey.  ____________________________________________   FINAL CLINICAL IMPRESSION(S) / ED DIAGNOSES  Final diagnoses:  Hematuria, unspecified type      NEW MEDICATIONS STARTED DURING THIS VISIT:  New Prescriptions   No medications on file     Note:  This document was prepared using Dragon voice recognition software and may include unintentional dictation errors.    Arta Silence, MD 08/13/20 1555

## 2020-08-13 NOTE — ED Notes (Signed)
Pt verbalized understanding of d/c instructions at this time. Pt denied further questions at this time. Pt ambulatory to lobby, NAD noted, steady gait noted, RR even and unlabored at this time.

## 2020-08-13 NOTE — ED Notes (Signed)
Pt spouse called per request at this time for pt d/c. Pt spouse stated she is on her way.

## 2020-08-13 NOTE — ED Provider Notes (Signed)
Patient care assumed from Dr. Marisa Severin.  Urinalysis appears well, urine culture has been sent as a precaution.  Patient will be discharged home.   Minna Antis, MD 08/13/20 [phone]

## 2020-08-13 NOTE — Discharge Instructions (Signed)
Return to the ER for new, worsening, or persistent severe blood in the urine, flank or back pain, vomiting, fever, weakness, recurrent episodes of feeling like you are going to pass out or actually passing out, or persistent or worsening dizziness.  You can try the meclizine (Antivert) as needed for the dizziness.  If the symptoms continue you should follow-up with an ENT specialist.  A referral has been provided.

## 2020-08-13 NOTE — ED Notes (Signed)
Pt denies any need to urinate at this time. Pt reminded to let this RN know in order to provide sample for urinalysis. Pt verbalized understanding at this time.

## 2020-08-13 NOTE — ED Triage Notes (Signed)
Pt via POV from home. Pt c/o blood in the urine, bright red urine. Pt had some tingling in his bilateral hands and feet. Pt states that it all started 30 mins. Pt has a hx of kidney stones. Pt is A&Ox4 and NAD. Denies pain.

## 2020-08-13 NOTE — ED Notes (Addendum)
Pt up to bathroom at this time, ambulatory without assistance. NAD noted, steady gait noted.   Pt given specimen cup for urine sample at this time

## 2020-08-15 LAB — URINE CULTURE: Culture: NO GROWTH

## 2021-04-30 ENCOUNTER — Emergency Department: Payer: Self-pay

## 2021-04-30 ENCOUNTER — Encounter: Payer: Self-pay | Admitting: Emergency Medicine

## 2021-04-30 ENCOUNTER — Other Ambulatory Visit: Payer: Self-pay

## 2021-04-30 ENCOUNTER — Emergency Department
Admission: EM | Admit: 2021-04-30 | Discharge: 2021-04-30 | Disposition: A | Discharge status: Home / Self Care | Payer: Self-pay | Attending: Emergency Medicine | Admitting: Emergency Medicine

## 2021-04-30 DIAGNOSIS — N183 Chronic kidney disease, stage 3 unspecified: Secondary | ICD-10-CM | POA: Insufficient documentation

## 2021-04-30 DIAGNOSIS — R0602 Shortness of breath: Secondary | ICD-10-CM | POA: Insufficient documentation

## 2021-04-30 DIAGNOSIS — R109 Unspecified abdominal pain: Secondary | ICD-10-CM | POA: Insufficient documentation

## 2021-04-30 LAB — BASIC METABOLIC PANEL
Anion gap: 7 (ref 5–15)
BUN: 11 mg/dL (ref 6–20)
CO2: 25 mmol/L (ref 22–32)
Calcium: 9.1 mg/dL (ref 8.9–10.3)
Chloride: 105 mmol/L (ref 98–111)
Creatinine, Ser: 1.61 mg/dL — ABNORMAL HIGH (ref 0.61–1.24)
GFR, Estimated: 55 mL/min — ABNORMAL LOW (ref >60)
Glucose, Bld: 84 mg/dL (ref 70–99)
Potassium: 3.5 mmol/L (ref 3.5–5.1)
Sodium: 137 mmol/L (ref 135–145)

## 2021-04-30 LAB — URINALYSIS, ROUTINE W REFLEX MICROSCOPIC
Bilirubin Urine: NEGATIVE
Glucose, UA: NEGATIVE mg/dL
Hgb urine dipstick: NEGATIVE
Ketones, ur: NEGATIVE mg/dL
Leukocytes,Ua: NEGATIVE
Nitrite: NEGATIVE
Protein, ur: NEGATIVE mg/dL
Specific Gravity, Urine: 1.025 (ref 1.005–1.030)
pH: 5 (ref 5.0–8.0)

## 2021-04-30 LAB — CBC
HCT: 41 % (ref 39.0–52.0)
Hemoglobin: 14.4 g/dL (ref 13.0–17.0)
MCH: 34 pg (ref 26.0–34.0)
MCHC: 35.1 g/dL (ref 30.0–36.0)
MCV: 96.7 fL (ref 80.0–100.0)
Platelets: 185 10*3/uL (ref 150–400)
RBC: 4.24 MIL/uL (ref 4.22–5.81)
RDW: 13 % (ref 11.5–15.5)
WBC: 5.5 10*3/uL (ref 4.0–10.5)
nRBC: 0 % (ref 0.0–0.2)

## 2021-04-30 NOTE — ED Notes (Signed)
Verified correct patient and correct discharge papers given. Pt alert and oriented X 4, stable for discharge. RR even and unlabored, color WNL. Discussed discharge instructions and follow-up as directed. Discharge medications discussed, when prescribed. Pt had opportunity to ask questions, and RN available to provide patient and/or family education.   

## 2021-04-30 NOTE — ED Notes (Signed)
EDP at bedside  

## 2021-04-30 NOTE — Discharge Instructions (Signed)
Your lab tests and CT scan today were all okay.  Please follow up with your doctor for continued monitoring of your symptoms.

## 2021-04-30 NOTE — ED Triage Notes (Signed)
Pt reports pain to right flank that radiates around and orange colored urine. Pt states hx of stones and states that it feels similar

## 2021-04-30 NOTE — ED Provider Notes (Signed)
Emergency Medicine Provider Triage Evaluation Note  Manuel Collins , a 40 y.o. male  was evaluated in triage.  Pt complains of right flank pain radiating to the left and, urinary color changes.  Patient has a history of nephrolithiasis, concerned that it may be his same as symptoms are similar.  Typically he reports that the pain is more unilateral but did start on the right is not spreading to the left..  Review of Systems  Positive: Flank pain, urinary changes Negative: Fever, chills, vomiting, diarrhea, constipation  Physical Exam  Ht 5\' 9"  (1.753 m)   Wt 93 kg   BMI 30.27 kg/m  Gen:   Awake, no distress   Resp:  Normal effort  MSK:   Moves extremities without difficulty  Other:  Bowel sounds x4 quadrants.  Slight CVA tenderness on the right side  Medical Decision Making  Medically screening exam initiated at 3:59 PM.  Appropriate orders placed.  was informed that the remainder of the evaluation will be completed by another provider, this initial triage assessment does not replace that evaluation, and the importance of remaining in the ED until their evaluation is complete.  Flank pain with urinary changes.  Patient will have basic labs, urine, CT stone   Paulene Floor, PA-C 04/30/21 1601    05/02/21, MD 05/01/21 0006

## 2021-04-30 NOTE — ED Provider Notes (Signed)
[facility] Emergency Department Provider Note  ____________________________________________  Time seen: Approximately 10:05 PM  I have reviewed the triage vital signs and the nursing notes.   HISTORY  Chief Complaint Flank Pain and Hematuria    HPI Manuel Collins is a 40 y.o. male with a history of GERD, Crohn's disease, kidney stones who comes ED complaining of right flank pain for the past 3 days, waxing and waning, no aggravating or alleviating factors.  Associated with a feeling of shortness of breath.  No chest pain.  No pleuritic pain.  Feels like a kidney stone, of which she has had multiple in the past.  No recent travel trauma hospitalization or surgery.  No history of DVT or PE.  Non-smoker.  Since arrival in the emergency department, he notes that the pain has substantially improved and has nearly resolved.  Shortness of breath has improved as well.    Past Medical History:  Diagnosis Date   Abdominal hernia    Collagen vascular disease (HCC)    Crohn disease (HCC)    GERD (gastroesophageal reflux disease)    Kidney disease, chronic, stage III (moderate, EGFR 30-59 ml/min) (HCC)    Renal insufficiency      There are no problems to display for this patient.    Past Surgical History:  Procedure Laterality Date   capsule endoscopy     COLONOSCOPY     ESOPHAGOGASTRODUODENOSCOPY       Prior to Admission medications   Medication Sig Start Date End Date Taking? Authorizing Provider  dicyclomine (BENTYL) 20 MG tablet Take 1 tablet (20 mg total) by mouth every 6 (six) hours as needed. Patient not taking: Reported on 09/06/2016 07/30/15   Paulette Blanch, MD  HYDROcodone-acetaminophen [facility]) 5-325 MG tablet Take 2 tablets by mouth every 6 (six) hours as needed for moderate pain. 07/21/20   Merlyn Lot, MD  ketorolac (TORADOL) 10 MG tablet Take 1 tablet (10 mg total) by mouth every 6 (six) hours as needed for moderate pain. 09/04/18    Carrie Mew, MD  meclizine (ANTIVERT) 25 MG tablet Take 1 tablet (25 mg total) by mouth 3 (three) times daily as needed for dizziness. 08/13/20   Arta Silence, MD  mesalamine (PENTASA) 500 MG CR capsule Take 500 mg by mouth 3 (three) times daily.    [provider]  ondansetron (ZOFRAN ODT) 4 MG disintegrating tablet Take 1 tablet (4 mg total) by mouth every 8 (eight) hours as needed for nausea or vomiting. 09/04/18   Carrie Mew, MD  phenazopyridine (PYRIDIUM) 200 MG tablet Take 1 tablet (200 mg total) by mouth 3 (three) times daily as needed for pain. 09/04/18   Carrie Mew, MD     Allergies Hydrocodeine [dihydrocodeine], Oxycodone, and Penicillins   No family history on file.  Social History Social History   Tobacco Use   Smoking status: Never   Smokeless tobacco: Never  Vaping Use   Vaping Use: Never used  Substance Use Topics   Alcohol use: No   Drug use: No    Review of Systems  Constitutional:   No fever or chills.  ENT:   No sore throat. No rhinorrhea. Cardiovascular:   No chest pain or syncope. Respiratory:   Positive shortness of breath without cough. Gastrointestinal:   Positive right flank pain without vomiting and diarrhea.  Musculoskeletal:   Negative for focal pain or swelling All other systems reviewed and are negative except as documented above in ROS and HPI.  ____________________________________________   PHYSICAL EXAM:  VITAL SIGNS: ED Triage Vitals  Enc Vitals Group     BP 04/30/21 1600 (!) 125/97     Pulse Rate 04/30/21 1600 (!) 128     Resp 04/30/21 1600 18     Temp 04/30/21 1600 99 F (37.2 C)     Temp Source 04/30/21 1600 Oral     SpO2 04/30/21 1600 97 %     Weight 04/30/21 1554 205 lb (93 kg)     Height 04/30/21 1554 _0  (1.753 m)     Head Circumference --      Peak Flow --      Pain Score 04/30/21 1554 5     Pain Loc --      Pain Edu? --      Excl. in [place]? --     Vital signs reviewed, nursing  assessments reviewed.   Constitutional:   Alert and oriented. Non-toxic appearance. Eyes:   Conjunctivae are normal. EOMI. PERRL. ENT      Head:   Normocephalic and atraumatic.      Nose:   Wearing a mask.      Mouth/Throat:   Wearing a mask.      Neck:   No meningismus. Full ROM. Hematological/Lymphatic/Immunilogical:   No cervical lymphadenopathy. Cardiovascular:   RRR, heart rate 80. Symmetric bilateral radial and DP pulses.  No murmurs. Cap refill less than 2 seconds. Respiratory:   Normal respiratory effort without tachypnea/retractions. Breath sounds are clear and equal bilaterally. No wheezes/rales/rhonchi. Gastrointestinal:   Soft and nontender. Non distended. There is no CVA tenderness.  No rebound, rigidity, or guarding. Genitourinary:   deferred Musculoskeletal:   Normal range of motion in all extremities. No joint effusions.  No lower extremity tenderness.  No edema. Neurologic:   Normal speech and language.  Motor grossly intact. No acute focal neurologic deficits are appreciated.  Skin:    Skin is warm, dry and intact. No rash noted.  No petechiae, purpura, or bullae.  ____________________________________________    LABS (pertinent positives/negatives) (all labs ordered are listed, but only abnormal results are displayed) Labs Reviewed  URINALYSIS, ROUTINE W REFLEX MICROSCOPIC - Abnormal; Notable for the following components:      Result Value   Color, Urine YELLOW (*)    APPearance CLEAR (*)    All other components within normal limits  BASIC METABOLIC PANEL - Abnormal; Notable for the following components:   Creatinine, Ser 1.61 (*)    GFR, Estimated 55 (*)    All other components within normal limits  CBC   ____________________________________________   EKG    ____________________________________________    RADIOLOGY  CT Renal Stone Study  Result Date: 04/30/2021 CLINICAL DATA:  Right flank pain. EXAM: CT ABDOMEN AND PELVIS WITHOUT CONTRAST  TECHNIQUE: Multidetector CT imaging of the abdomen and pelvis was performed following the standard protocol without IV contrast. COMPARISON:  CT abdomen pelvis 07/21/2020 FINDINGS: Lower chest: No acute abnormality. Evaluation of the abdominal viscera is limited by the lack of IV contrast. Hepatobiliary: No focal liver abnormality is seen. Normal gallbladder. Pancreas: Unremarkable. No surrounding inflammatory changes. Spleen: Normal in size without focal abnormality. Adrenals/Urinary Tract: Adrenal glands are unremarkable. Kidneys are symmetric in size. Bilateral medullary nephrocalcinosis. No hydronephrosis. No renal mass lesion. Urinary bladder is unremarkable. Stomach/Bowel: Stomach is within normal limits. Appendix appears normal. No evidence of bowel wall thickening, distention, or inflammatory changes. Vascular/Lymphatic: No significant vascular findings are present. No enlarged abdominal or pelvic lymph nodes.  Reproductive: Uterus and bilateral adnexa are unremarkable. Other: No abdominal wall hernia or abnormality. No abdominopelvic ascites. Musculoskeletal: No acute or significant osseous findings. IMPRESSION: 1. No acute intra-abdominal pathology on a noncontrast CT. 2. Bilateral medullary nephrocalcinosis. Electronically Signed   By: Audie Pinto M.D.   On: 04/30/2021 16:30    ____________________________________________   PROCEDURES Procedures  ____________________________________________  DIFFERENTIAL DIAGNOSIS   Ureterolithiasis, cystitis, musculoskeletal pain  CLINICAL IMPRESSION / ASSESSMENT AND PLAN / ED COURSE  Medications ordered in the ED: Medications - No data to display  Pertinent labs & imaging results that were available during my care of the patient were reviewed by me and considered in my medical decision making (see chart for details).  Manuel Collins was evaluated in Emergency Department on 04/30/2021 for the symptoms described in the history of present  illness. He was evaluated in the context of the global COVID-19 pandemic, which necessitated consideration that the patient might be at risk for infection with the SARS-CoV-2 virus that causes COVID-19. Institutional protocols and algorithms that pertain to the evaluation of patients at risk for COVID-19 are in a state of rapid change based on information released by regulatory bodies including the CDC and federal and state organizations. These policies and algorithms were followed during the patient's care in the ED.   Patient presents with right flank pain.  Shortness of breath symptom appears to be anxiety related according to the patient. Considering the patient's symptoms, medical history, and physical examination today, I have low suspicion for ACS, PE, TAD, pneumothorax, carditis, mediastinitis, pneumonia, CHF, or sepsis.  On my exam vital signs are normal.  Labs are normal.  CT abdomen pelvis negative, no signs of ureteral stone, lung bases clear.  Suspected patient recently passed a stone around the time that he arrived in the ED.  He is feeling better and stable for discharge to follow-up with his PCP.      ____________________________________________   FINAL CLINICAL IMPRESSION(S) / ED DIAGNOSES    Final diagnoses:  Flank pain     ED Discharge Orders     None       Portions of this note were generated with dragon dictation software. Dictation errors may occur despite best attempts at proofreading.    Carrie Mew, MD 04/30/21 2210

## 2022-06-09 ENCOUNTER — Emergency Department: Payer: Self-pay

## 2022-06-09 ENCOUNTER — Emergency Department
Admission: EM | Admit: 2022-06-09 | Discharge: 2022-06-09 | Disposition: A | Discharge status: Home / Self Care | Payer: Self-pay | Attending: Emergency Medicine | Admitting: Emergency Medicine

## 2022-06-09 ENCOUNTER — Other Ambulatory Visit: Payer: Self-pay

## 2022-06-09 DIAGNOSIS — N183 Chronic kidney disease, stage 3 unspecified: Secondary | ICD-10-CM | POA: Insufficient documentation

## 2022-06-09 DIAGNOSIS — F419 Anxiety disorder, unspecified: Secondary | ICD-10-CM | POA: Insufficient documentation

## 2022-06-09 LAB — BASIC METABOLIC PANEL
Anion gap: 7 (ref 5–15)
BUN: 14 mg/dL (ref 6–20)
CO2: 25 mmol/L (ref 22–32)
Calcium: 9.2 mg/dL (ref 8.9–10.3)
Chloride: 109 mmol/L (ref 98–111)
Creatinine, Ser: 1.43 mg/dL — ABNORMAL HIGH (ref 0.61–1.24)
GFR, Estimated: >60 mL/min (ref >60)
Glucose, Bld: 106 mg/dL — ABNORMAL HIGH (ref 70–99)
Potassium: 4.1 mmol/L (ref 3.5–5.1)
Sodium: 141 mmol/L (ref 135–145)

## 2022-06-09 LAB — CBC
HCT: 41.8 % (ref 39.0–52.0)
Hemoglobin: 14.8 g/dL (ref 13.0–17.0)
MCH: 33.4 pg (ref 26.0–34.0)
MCHC: 35.4 g/dL (ref 30.0–36.0)
MCV: 94.4 fL (ref 80.0–100.0)
Platelets: 196 10*3/uL (ref 150–400)
RBC: 4.43 MIL/uL (ref 4.22–5.81)
RDW: 12.1 % (ref 11.5–15.5)
WBC: 7 10*3/uL (ref 4.0–10.5)
nRBC: 0 % (ref 0.0–0.2)

## 2022-06-09 MED ORDER — ALPRAZOLAM 0.25 MG PO TABS: 0.2500 mg | ORAL_TABLET | Freq: Every evening | ORAL | 0 refills | Status: AC | PRN

## 2022-06-09 MED ORDER — ALPRAZOLAM 0.5 MG PO TABS
0.2500 mg | ORAL_TABLET | Freq: Once | ORAL | Status: AC
Start: 2022-06-09 — End: 2022-06-09
  Administered 2022-06-09: 0.25 mg via ORAL
  Filled 2022-06-09: qty 1

## 2022-06-09 MED ORDER — LORAZEPAM 1 MG PO TABS: 1.0000 mg | ORAL_TABLET | Freq: Once | ORAL | Status: DC

## 2022-06-09 NOTE — ED Triage Notes (Signed)
Patient reports recurrent anxiety and panic attacks. Denies new stressors. Reports waiting to speak with pcp regarding medication management. Pt cooperative in triage but notably anxious. Pt alert and oriented following commands. Breathing unlabored speaking in full sentences.

## 2022-06-09 NOTE — ED Provider Notes (Signed)
[facility] Provider Note    Event Date/Time   First MD Initiated Contact with Patient 06/09/22 2048     (approximate)   History   Anxiety   HPI Manuel Collins is a 41 y.o. male with a history of Crohn's disease and stage III kidney disease  He reports he has been suffering from significant anxiety for some time now and has always had concerns about taking medications and concern that he might die during his sleep.  The symptoms just came to seem to be getting worse and worse to the point now that he feels like he cannot even fall asleep.  Reports he just feels like he is suffering from constant anxiety attacks  No fevers no chills no cough he reports when the symptoms happen he will feel short of breath he does not get any chest pain.  No abdominal pain no nausea or vomiting.  He will just feel like suddenly and the world is closing in on him and like he Is dying  Right now he feels [place].  A lot of his anxiety and stress is also related to having a child with autism whom he cares for    No long trips or travel.  No leg swelling.  No history of blood clots.  No history of recent surgeries.  Physical Exam   Triage Vital Signs: ED Triage Vitals [06/09/22 2031]  Enc Vitals Group     BP (!) 132/109     Pulse Rate 83     Resp (!) 22     Temp 98.2 F (36.8 C)     Temp Source Oral     SpO2 96 %     Weight 205 lb (93 kg)     Height 5\' 9"  (1.753 m)     Head Circumference      Peak Flow      Pain Score 0     Pain Loc      Pain Edu?      Excl. in [place]?     Most recent vital signs: Vitals:   06/09/22 2031  BP: (!) 132/109  Pulse: 83  Resp: (!) 22  Temp: 98.2 F (36.8 C)  SpO2: 96%     General: Awake, no distress.  CV:  Good peripheral perfusion.  Normal heart tones and rate Resp:  Normal effort.  Normal respiratory pattern Abd:  No distention.  Soft nontender nondistended throughout Other:    Does not appear overtly anxious at this  point, perhaps just a little bit anxious as he describes his symptoms.  He is very calm.  His father will be driving him home.     ED Results / Procedures / Treatments   Labs (all labs ordered are listed, but only abnormal results are displayed) Labs Reviewed  BASIC METABOLIC PANEL - Abnormal; Notable for the following components:      Result Value   Glucose, Bld 106 (*)    Creatinine, Ser 1.43 (*)    All other components within normal limits  CBC     EKG  And interpreted by me at 2132 heart rate 70 QRS 90 QTc 430 Normal sinus rhythm no evidence of acute ischemia   RADIOLOGY Chest x-ray interpreted by me as negative for acute    PROCEDURES:  Critical Care performed: No  Procedures   MEDICATIONS ORDERED IN ED: Medications  ALPRAZolam Duanne Moron) tablet 0.25 mg (0.25 mg Oral Given 06/09/22 2130)     IMPRESSION /  MDM / ASSESSMENT AND PLAN / ED COURSE  I reviewed the triage vital signs and the nursing notes.                              Differential diagnosis includes, but is not limited to, possible panic or anxiety which seem likely based on his clinical description the chronicity of his symptoms and also his concerns.  He denies any suicidal or harmful ideations.  He is alert and well oriented and does not appear acutely psychotic or manic.  I will trial a small dose of Xanax for relief, discussed with him this is not a good long-term solution, and he is not driving while taking, but he is very interested in following up with outpatient resources including [facility] on Tuesday.  Based on his medical history with his reported history of chronic kidney disease we will check medical screening labs.  He denies any infectious symptoms.  Chest check a chest x-ray to evaluate to make certain no acute structural pulmonary disease this seems highly unlikely.  No clinical signs or symptoms does be suggestive of pulmonary embolism.  PERC negative  Patient's presentation is most  consistent with acute complicated illness / injury requiring diagnostic workup.    ----------------------------------------- 10:33 PM on 06/09/2022 ----------------------------------------- Resting comfortably, symptoms improved.  In no distress.  Reports that alprazolam helped and he is resting comfortably.  Plan to discharge with careful return precautions and follow-up with [facility] he and his father who is with him are both understanding agreeable.  Father driving home  All workup here including EKG chest x-ray and labs very reassuring.  Chronic known renal disease  CBC normal.  Creatinine 1.4  FINAL CLINICAL IMPRESSION(S) / ED DIAGNOSES   Final diagnoses:  Anxiety   Alprazolam prescription printed.  Computer error preventing me from sending electronically  Rx / DC Orders   ED Discharge Orders          Ordered    ALPRAZolam (XANAX) 0.25 MG tablet  At bedtime PRN        06/09/22 2231             Note:  This document was prepared using Dragon voice recognition software and may include unintentional dictation errors.   Sharyn Creamer, MD 06/09/22 2234

## 2022-06-09 NOTE — ED Triage Notes (Signed)
Pt denies chest pain or pressure. Reports SOB when "attacks" occur

## 2022-06-09 NOTE — ED Provider Triage Note (Signed)
Emergency Medicine Provider Triage Evaluation Note  Manuel Collins , a 41 y.o. male  was evaluated in triage.  Pt complains of anxiety, denies si/hi.  Review of Systems  Positive:  Negative:  Physical Exam  BP (!) 132/109 (BP Location: Left Arm)   Pulse 83   Temp 98.2 F (36.8 C) (Oral)   Resp (!) 22   Ht 5\' 9"  (1.753 m)   Wt 93 kg   SpO2 96%   BMI 30.27 kg/m  Gen:   Awake, no distress   Resp:  Normal effort  MSK:   Moves extremities without difficulty  Other:    Medical Decision Making  Medically screening exam initiated at 8:41 PM.  Appropriate orders placed.  was informed that the remainder of the evaluation will be completed by another provider, this initial triage assessment does not replace that evaluation, and the importance of remaining in the ED until their evaluation is complete.     Paulene Floor, PA-C 06/09/22 2042

## 2022-06-09 NOTE — Discharge Instructions (Addendum)
No driving this evening or within 8 hours of use of alprazolam.  Use only as prescribed.  Follow-up closely with [facility] on Tuesday, I also recommend you call and schedule close follow-up with your primary care doctor as well.  Return to the emergency room right away if you start having trouble breathing, chest pain, fevers, passout feel weak, or other new concerns or symptoms arise.

## 2022-09-17 IMAGING — CT CT ABD-PELV W/ CM
2 of 5 series · 16 of 46 positions shown, 18 images · IV contrast (APPLIED)
Comparison: 09/02/2018

CLINICAL DATA: Flank pain on the right

EXAM:
CT ABDOMEN AND PELVIS WITH CONTRAST
TECHNIQUE: Multidetector CT imaging of the abdomen and pelvis was performed
using the standard protocol following bolus administration of
intravenous contrast.
CONTRAST:  100mL OMNIPAQUE IOHEXOL 300 MG/ML  SOLN

[Series 2: routine abd/pel with · axial · 0.87mm/px · z∈[-540,-65]mm · 13 of 107 slices shown, 15 images]
[im 6/107  soft-tissue]
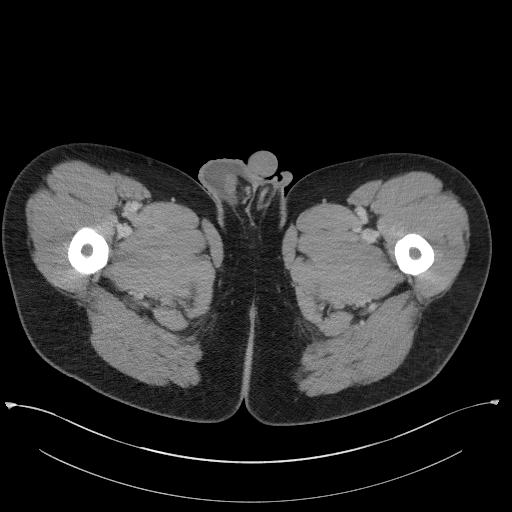
[im 6/107  bone]
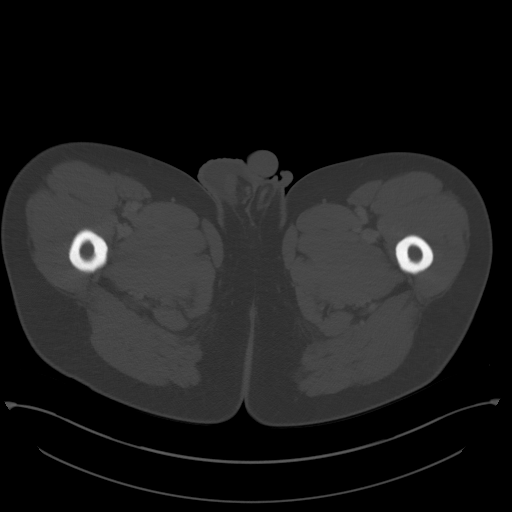
[im 16/107  soft-tissue]
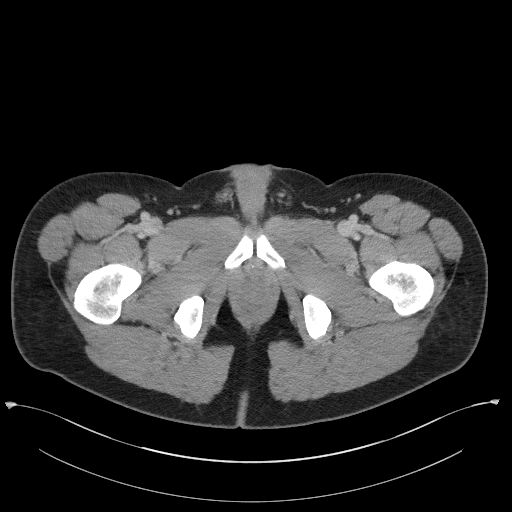
[im 21/107  soft-tissue]
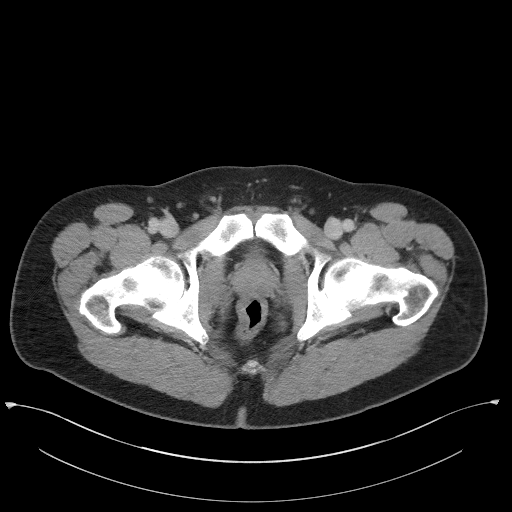
[im 31/107  soft-tissue]
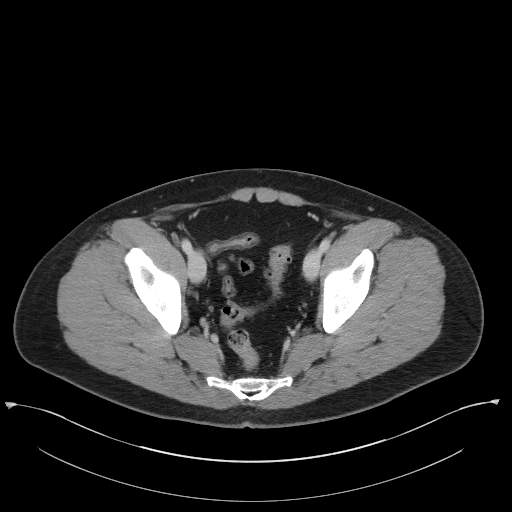
[im 36/107  soft-tissue]
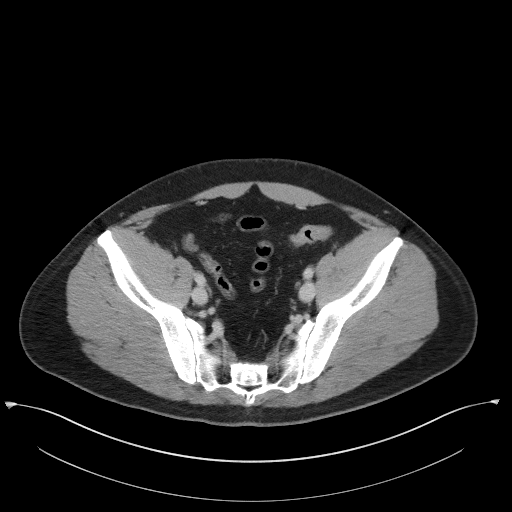
[im 46/107  soft-tissue]
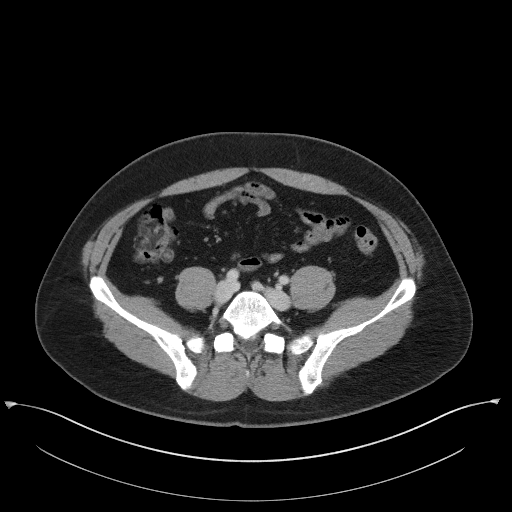
[im 56/107  soft-tissue]
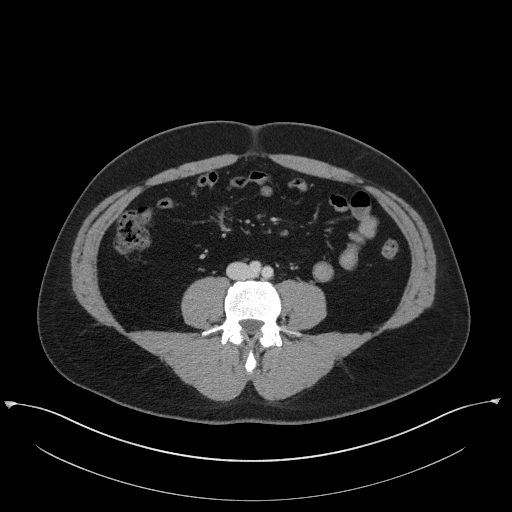
[im 61/107  soft-tissue]
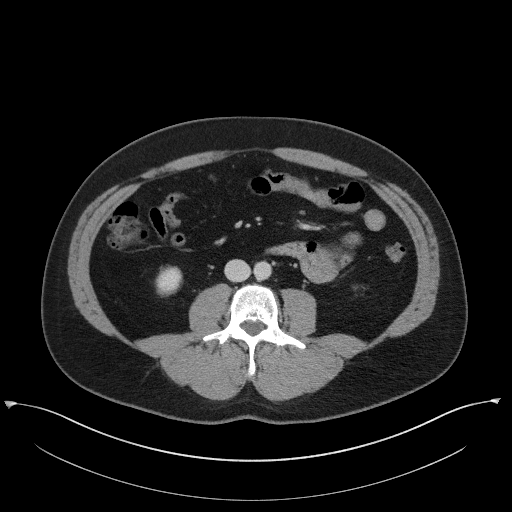
[im 71/107  soft-tissue]
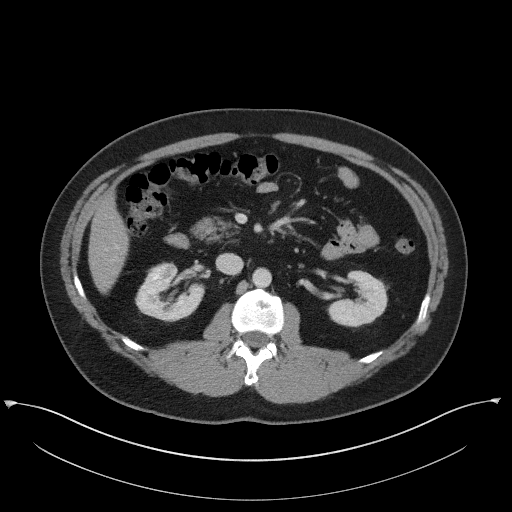
[im 71/107  bone]
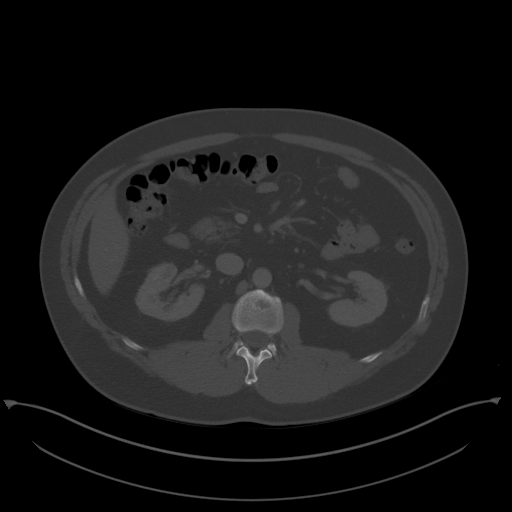
[im 76/107  soft-tissue]
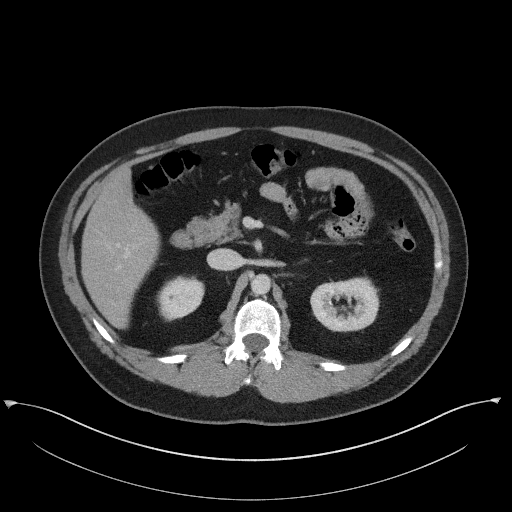
[im 86/107  soft-tissue]
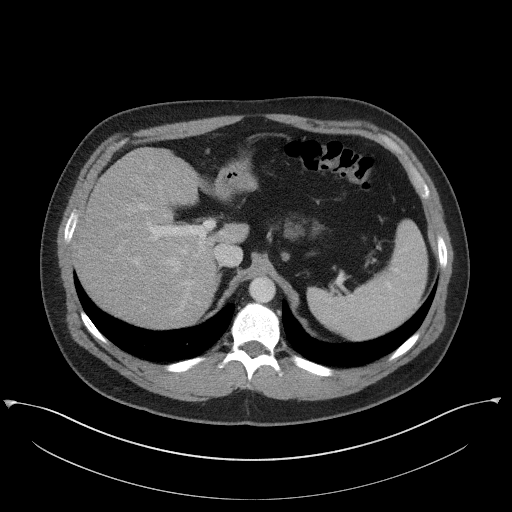
[im 91/107  soft-tissue]
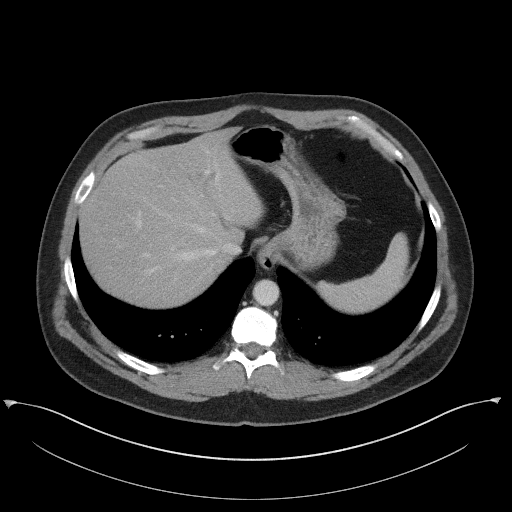
[im 101/107  soft-tissue]
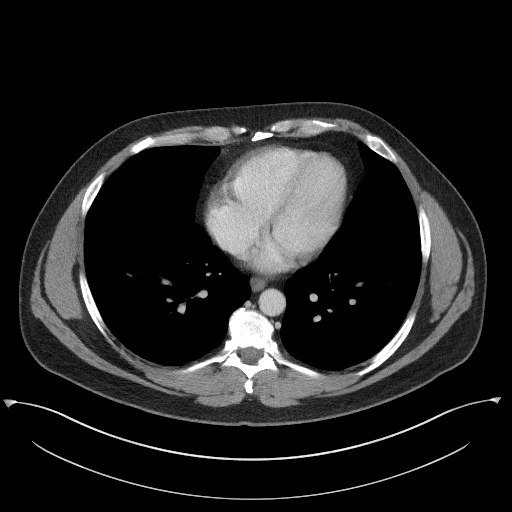

[Series 7: coronal st · coronal · 0.85mm/px · 3 of 98 slices shown]
[im 33/98  soft-tissue]
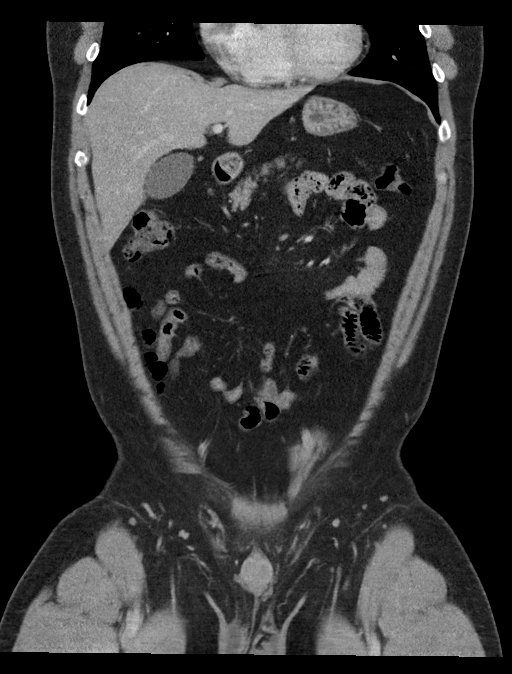
[im 44/98  soft-tissue]
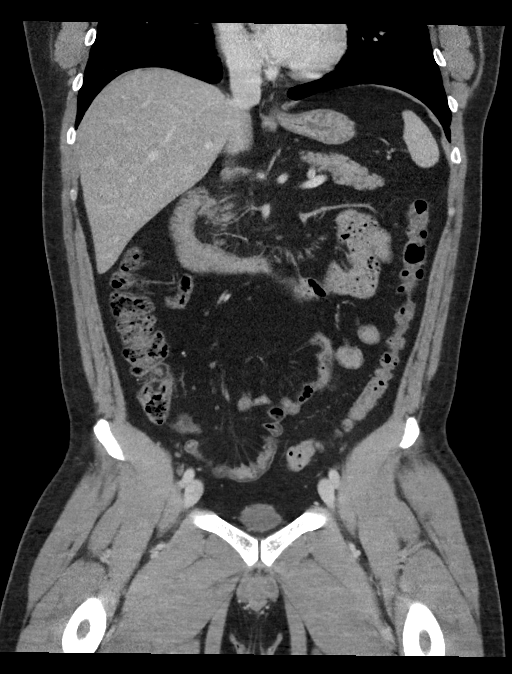
[im 54/98  soft-tissue]
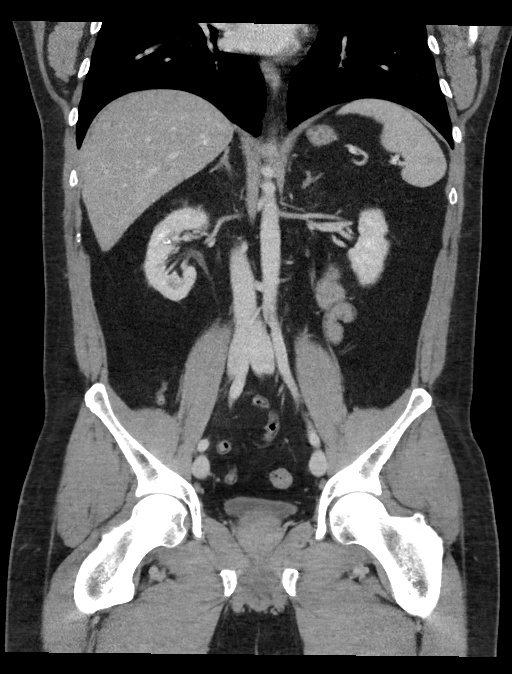

[16 of 46 positions shown; findings below may reference images not displayed]

FINDINGS: Lower chest: No acute abnormality.

Hepatobiliary: No focal liver abnormality is seen. No gallstones,
gallbladder wall thickening, or biliary dilatation.

Pancreas: Unremarkable. No pancreatic ductal dilatation or
surrounding inflammatory changes.

Spleen: Normal in size without focal abnormality.

Adrenals/Urinary Tract: Adrenal glands are within normal limits.
Kidneys demonstrate a normal enhancement pattern. Scattered tiny
nonobstructing renal calculi are noted. The ureters are within
normal limits bilaterally. Bladder is decompressed.

Stomach/Bowel: Mild diverticular change of the colon is noted. No
obstructive or inflammatory changes are seen. The appendix is within
normal limits. No small bowel or gastric abnormality is noted.

Vascular/Lymphatic: No significant vascular findings are present. No
enlarged abdominal or pelvic lymph nodes.

Reproductive: Prostate is unremarkable.

Other: No abdominal wall hernia or abnormality. No abdominopelvic
ascites.

Musculoskeletal: No acute or significant osseous findings.
IMPRESSION: No acute abnormality noted

Bilateral nonobstructing small renal calculi stable from the prior
exam.

## 2023-06-27 IMAGING — CT CT RENAL STONE PROTOCOL
2 of 4 series · 16 of 46 positions shown, 18 images · non-contrast
Comparison: CT abdomen pelvis 07/21/2020

CLINICAL DATA: Right flank pain.

EXAM:
CT ABDOMEN AND PELVIS WITHOUT CONTRAST
TECHNIQUE: Multidetector CT imaging of the abdomen and pelvis was performed
following the standard protocol without IV contrast.

[Series 2: stone full standard · axial · 0.84mm/px · z∈[-1056,-581]mm · 13 of 105 slices shown, 15 images]
[im 5/105  soft-tissue]
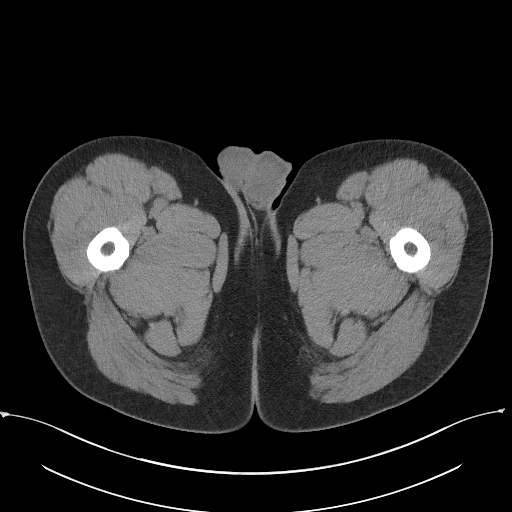
[im 5/105  bone]
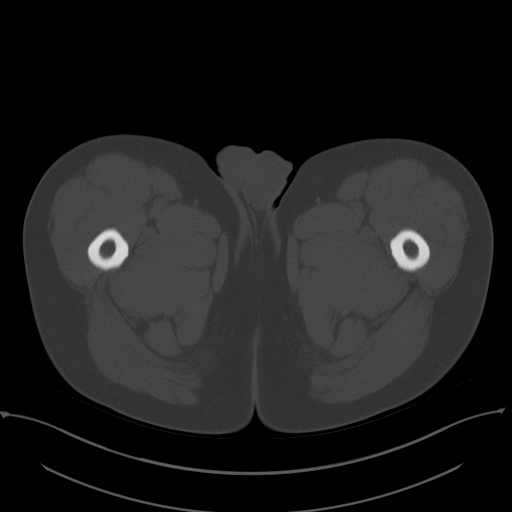
[im 14/105  soft-tissue]
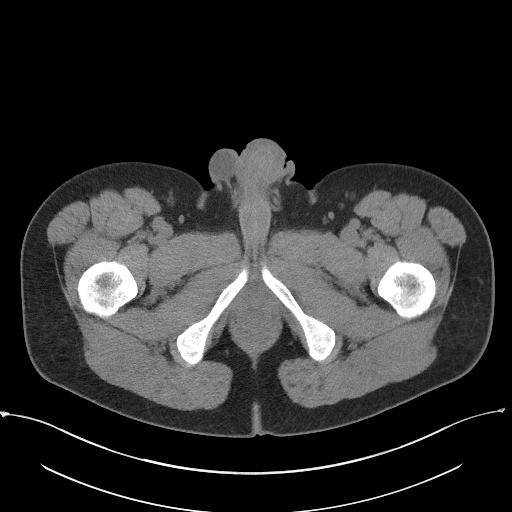
[im 22/105  soft-tissue]
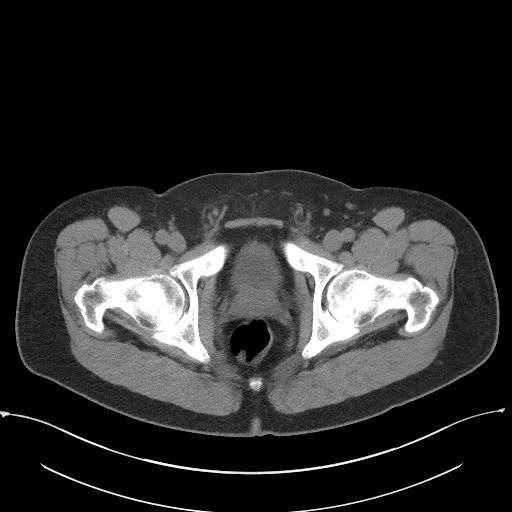
[im 31/105  soft-tissue]
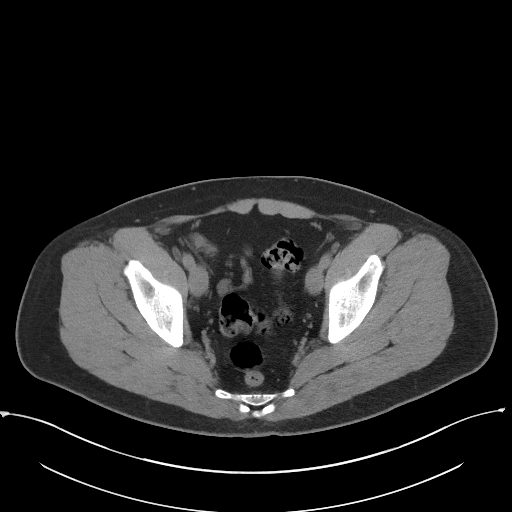
[im 35/105  soft-tissue]
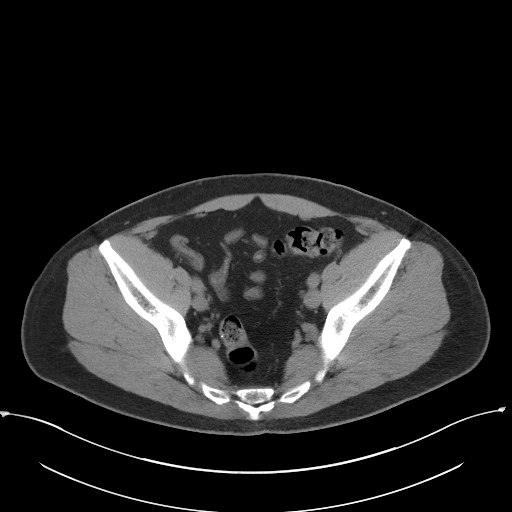
[im 44/105  soft-tissue]
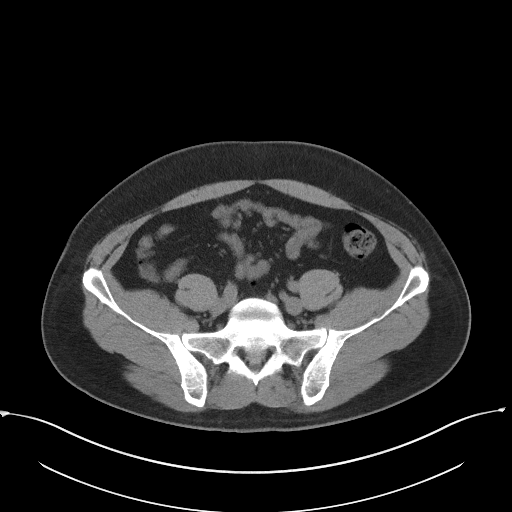
[im 53/105  soft-tissue]
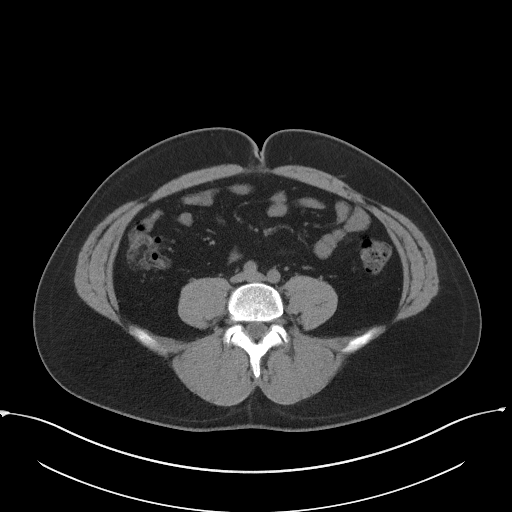
[im 61/105  soft-tissue]
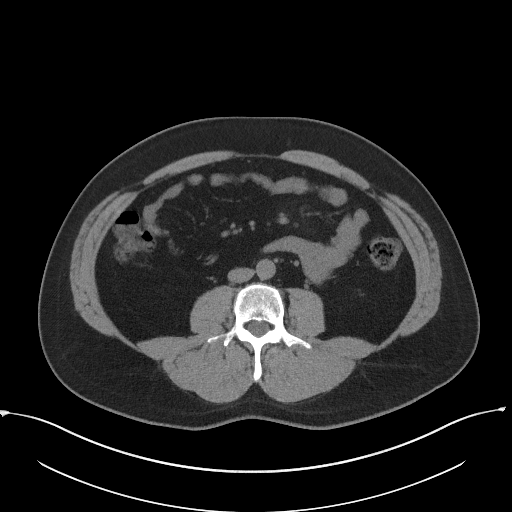
[im 70/105  soft-tissue]
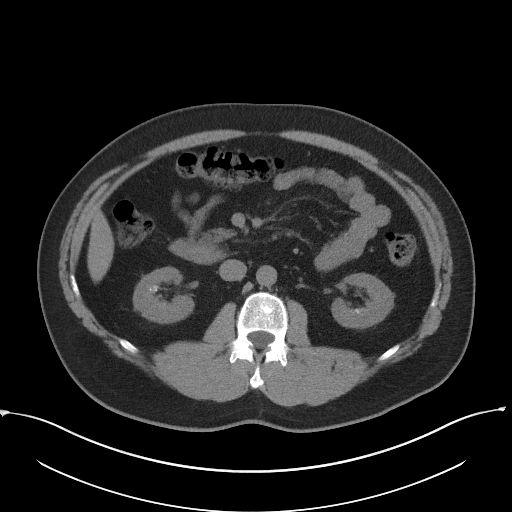
[im 70/105  bone]
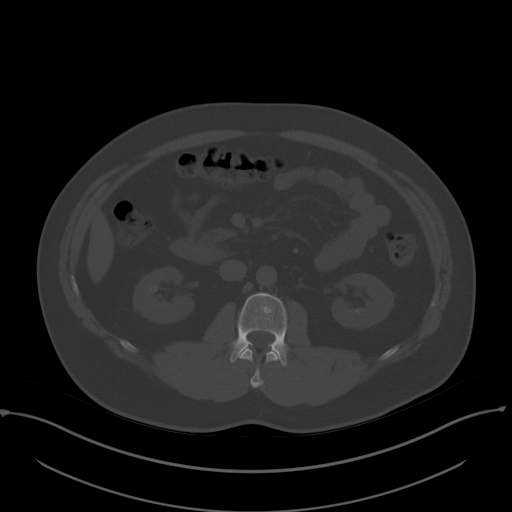
[im 74/105  soft-tissue]
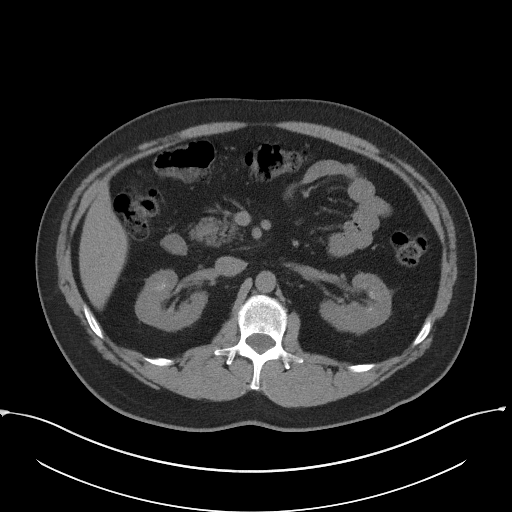
[im 83/105  soft-tissue]
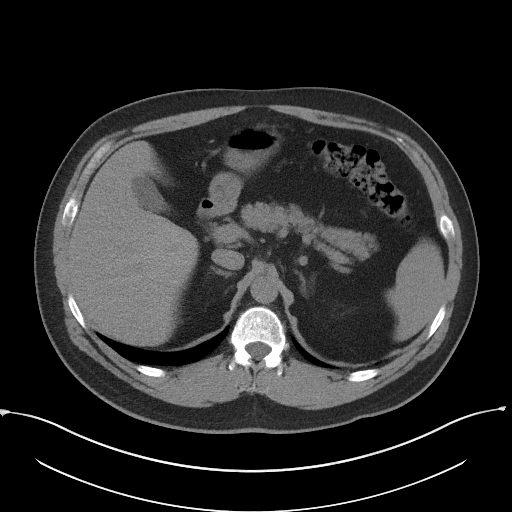
[im 92/105  soft-tissue]
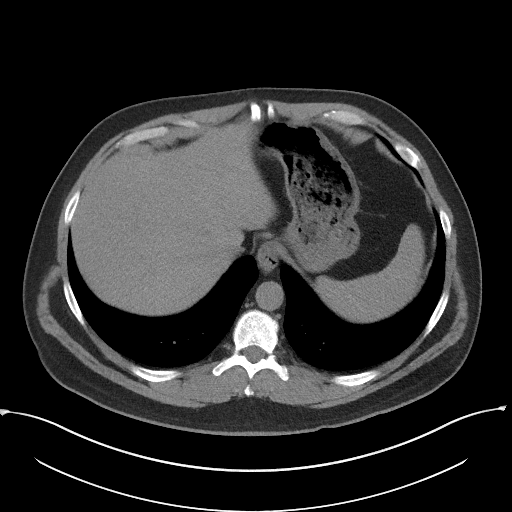
[im 100/105  soft-tissue]
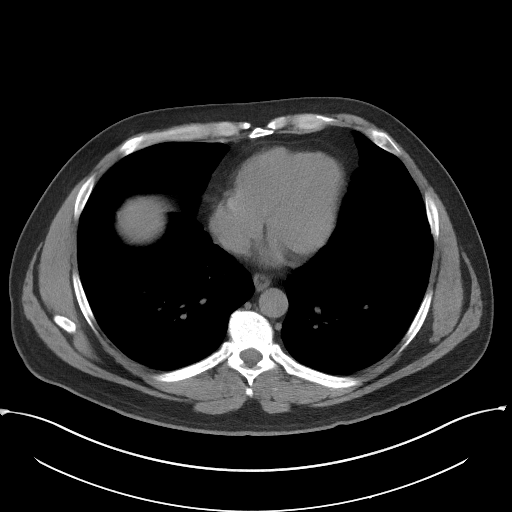

[Series 5: coronal · coronal · 0.84mm/px · 3 of 150 slices shown]
[im 50/150  soft-tissue]
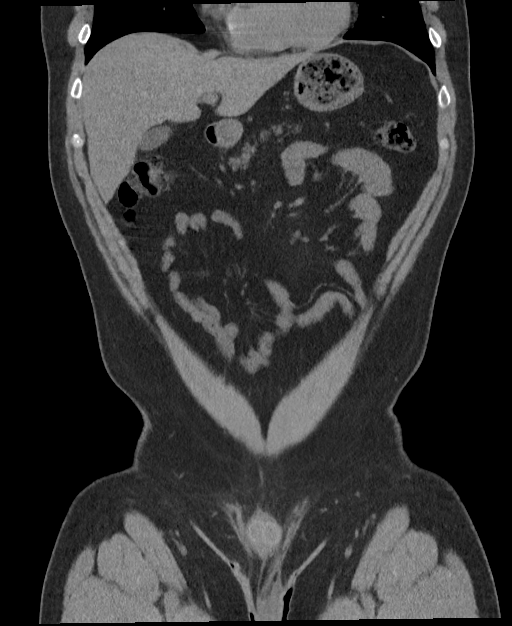
[im 67/150  soft-tissue]
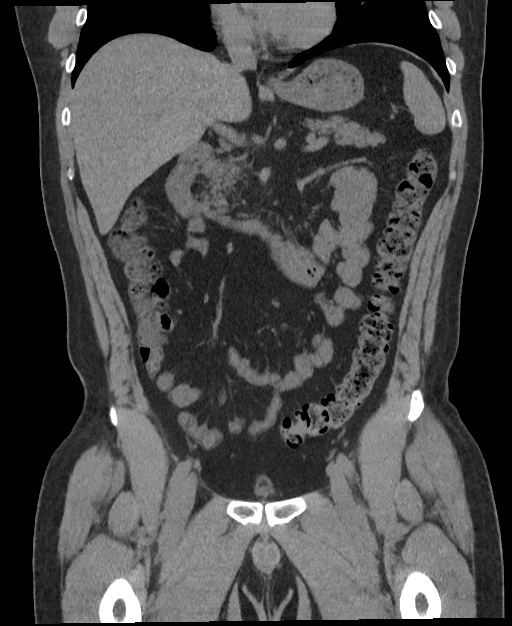
[im 83/150  soft-tissue]
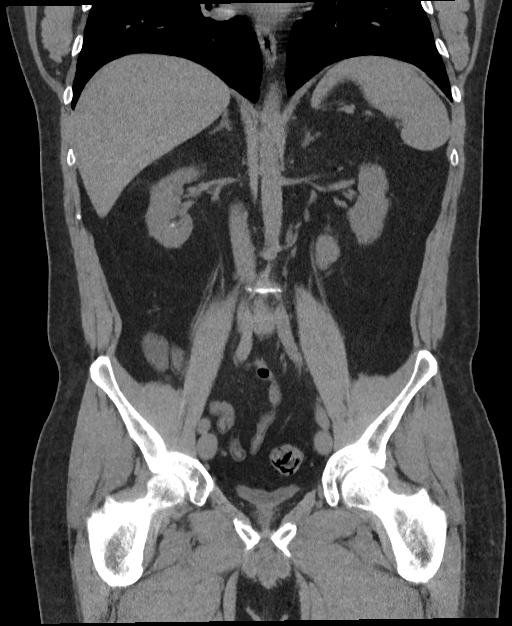

[16 of 46 positions shown; findings below may reference images not displayed]

FINDINGS: Lower chest: No acute abnormality.

Evaluation of the abdominal viscera is limited by the lack of IV
contrast.

Hepatobiliary: No focal liver abnormality is seen. Normal
gallbladder.

Pancreas: Unremarkable. No surrounding inflammatory changes.

Spleen: Normal in size without focal abnormality.

Adrenals/Urinary Tract: Adrenal glands are unremarkable. Kidneys are
symmetric in size. Bilateral medullary nephrocalcinosis. No
hydronephrosis. No renal mass lesion. Urinary bladder is
unremarkable.

Stomach/Bowel: Stomach is within normal limits. Appendix appears
normal. No evidence of bowel wall thickening, distention, or
inflammatory changes.

Vascular/Lymphatic: No significant vascular findings are present. No
enlarged abdominal or pelvic lymph nodes.

Reproductive: Uterus and bilateral adnexa are unremarkable.

Other: No abdominal wall hernia or abnormality. No abdominopelvic
ascites.

Musculoskeletal: No acute or significant osseous findings.
IMPRESSION: 1. No acute intra-abdominal pathology on a noncontrast CT.
2. Bilateral medullary nephrocalcinosis.
